# Patient Record
Sex: Female | Born: 1970 | Race: Black or African American | Hispanic: No | Marital: Married | Smoking: Never smoker
Health system: Southern US, Community
[De-identification: ages and names within clinical notes are randomized; demographics above are authoritative.]

## PROBLEM LIST (undated history)

## (undated) DIAGNOSIS — E669 Obesity, unspecified: Secondary | ICD-10-CM

## (undated) DIAGNOSIS — I2510 Atherosclerotic heart disease of native coronary artery without angina pectoris: Secondary | ICD-10-CM

## (undated) DIAGNOSIS — I10 Essential (primary) hypertension: Secondary | ICD-10-CM

## (undated) DIAGNOSIS — G43909 Migraine, unspecified, not intractable, without status migrainosus: Secondary | ICD-10-CM

## (undated) HISTORY — PX: BACK SURGERY: SHX140

## (undated) HISTORY — DX: Obesity, unspecified: E66.9

## (undated) HISTORY — DX: Atherosclerotic heart disease of native coronary artery without angina pectoris: I25.10

## (undated) HISTORY — DX: Essential (primary) hypertension: I10

---

## 2008-08-12 ENCOUNTER — Emergency Department (HOSPITAL_BASED_OUTPATIENT_CLINIC_OR_DEPARTMENT_OTHER): Admission: EM | Admit: 2008-08-12 | Discharge: 2008-08-12 | Payer: Self-pay | Admitting: Emergency Medicine

## 2009-06-12 ENCOUNTER — Encounter: Admission: RE | Admit: 2009-06-12 | Discharge: 2009-06-12 | Payer: Self-pay | Admitting: Infectious Diseases

## 2010-11-15 ENCOUNTER — Encounter: Payer: Self-pay | Admitting: Family Medicine

## 2012-07-31 ENCOUNTER — Other Ambulatory Visit (HOSPITAL_COMMUNITY)
Admission: RE | Admit: 2012-07-31 | Discharge: 2012-07-31 | Disposition: A | Discharge status: Home / Self Care | Payer: Self-pay | Source: Ambulatory Visit | Attending: Obstetrics and Gynecology | Admitting: Obstetrics and Gynecology

## 2012-07-31 DIAGNOSIS — Z124 Encounter for screening for malignant neoplasm of cervix: Secondary | ICD-10-CM | POA: Insufficient documentation

## 2020-10-01 ENCOUNTER — Emergency Department (HOSPITAL_BASED_OUTPATIENT_CLINIC_OR_DEPARTMENT_OTHER)
Admission: EM | Admit: 2020-10-01 | Discharge: 2020-10-01 | Disposition: A | Discharge status: Home / Self Care | Payer: 59 | Attending: Emergency Medicine | Admitting: Emergency Medicine

## 2020-10-01 ENCOUNTER — Other Ambulatory Visit: Payer: Self-pay

## 2020-10-01 ENCOUNTER — Emergency Department (HOSPITAL_BASED_OUTPATIENT_CLINIC_OR_DEPARTMENT_OTHER): Payer: 59

## 2020-10-01 ENCOUNTER — Encounter (HOSPITAL_BASED_OUTPATIENT_CLINIC_OR_DEPARTMENT_OTHER): Payer: Self-pay

## 2020-10-01 DIAGNOSIS — R079 Chest pain, unspecified: Secondary | ICD-10-CM

## 2020-10-01 DIAGNOSIS — M25512 Pain in left shoulder: Secondary | ICD-10-CM | POA: Insufficient documentation

## 2020-10-01 DIAGNOSIS — R0789 Other chest pain: Secondary | ICD-10-CM | POA: Insufficient documentation

## 2020-10-01 HISTORY — DX: Migraine, unspecified, not intractable, without status migrainosus: G43.909

## 2020-10-01 LAB — CBC WITH DIFFERENTIAL/PLATELET
Abs Immature Granulocytes: 0.02 10*3/uL (ref 0.00–0.07)
Basophils Absolute: 0.1 10*3/uL (ref 0.0–0.1)
Basophils Relative: 1 %
Eosinophils Absolute: 0.1 10*3/uL (ref 0.0–0.5)
Eosinophils Relative: 2 %
HCT: 36.9 % (ref 36.0–46.0)
Hemoglobin: 11.3 g/dL — ABNORMAL LOW (ref 12.0–15.0)
Immature Granulocytes: 0 %
Lymphocytes Relative: 47 %
Lymphs Abs: 3.4 10*3/uL (ref 0.7–4.0)
MCH: 24.1 pg — ABNORMAL LOW (ref 26.0–34.0)
MCHC: 30.6 g/dL (ref 30.0–36.0)
MCV: 78.8 fL — ABNORMAL LOW (ref 80.0–100.0)
Monocytes Absolute: 0.4 10*3/uL (ref 0.1–1.0)
Monocytes Relative: 6 %
Neutro Abs: 3.1 10*3/uL (ref 1.7–7.7)
Neutrophils Relative %: 44 %
Platelets: 433 10*3/uL — ABNORMAL HIGH (ref 150–400)
RBC: 4.68 MIL/uL (ref 3.87–5.11)
RDW: 14.4 % (ref 11.5–15.5)
WBC: 7.1 10*3/uL (ref 4.0–10.5)
nRBC: 0 % (ref 0.0–0.2)

## 2020-10-01 LAB — COMPREHENSIVE METABOLIC PANEL WITH GFR
ALT: 18 U/L (ref 0–44)
AST: 19 U/L (ref 15–41)
Albumin: 4 g/dL (ref 3.5–5.0)
Alkaline Phosphatase: 69 U/L (ref 38–126)
Anion gap: 6 (ref 5–15)
BUN: 15 mg/dL (ref 6–20)
CO2: 27 mmol/L (ref 22–32)
Calcium: 9 mg/dL (ref 8.9–10.3)
Chloride: 106 mmol/L (ref 98–111)
Creatinine, Ser: 0.86 mg/dL (ref 0.44–1.00)
GFR, Estimated: >60 mL/min
Glucose, Bld: 104 mg/dL — ABNORMAL HIGH (ref 70–99)
Potassium: 3.6 mmol/L (ref 3.5–5.1)
Sodium: 139 mmol/L (ref 135–145)
Total Bilirubin: 0.5 mg/dL (ref 0.3–1.2)
Total Protein: 7.7 g/dL (ref 6.5–8.1)

## 2020-10-01 LAB — TROPONIN I (HIGH SENSITIVITY): Troponin I (High Sensitivity): 8 ng/L (ref <18)

## 2020-10-01 LAB — D-DIMER, QUANTITATIVE: D-Dimer, Quant: <0.27 ug{FEU}/mL (ref 0.00–0.50)

## 2020-10-01 NOTE — ED Provider Notes (Signed)
[facility] EMERGENCY DEPARTMENT Provider Note   CSN: [id number] Arrival date & time: 10/01/20  1441     History Chief Complaint  Patient presents with  . Shoulder Pain    Jasmin Swanson is a 49 y.o. female.  HPI Patient is a 49 year old female with a history of migraines as well as hypertension who presents to the emergency department due to chest pain.  Patient states the pain started about 2 days ago.  No known trauma.  She reports pain to the left scapular region as well as the left chest.  Pain worsens with deep breaths as well as movement of the left upper extremity.  Mildly alleviates when sitting still.  She states her pain waxes and wanes.  Does not fully alleviate.  No associated shortness of breath or diaphoresis.  No history of blood clots, leg swelling, recent surgery/injury, immobilization.  She notes having a 2-hour plane ride about 1 month ago but otherwise no recent travel.  No fevers, abdominal pain, nausea, vomiting.    Past Medical History:  Diagnosis Date  . Migraine     There are no problems to display for this patient.   Past Surgical History:  Procedure Laterality Date  . BACK SURGERY       OB History   No obstetric history on file.     No family history on file.  Social History   Tobacco Use  . Smoking status: Never Smoker  . Smokeless tobacco: Never Used  Vaping Use  . Vaping Use: Never used  Substance Use Topics  . Alcohol use: Never  . Drug use: Never    Home Medications Prior to Admission medications   Not on File    Allergies    Patient has no known allergies.  Review of Systems   Review of Systems  All other systems reviewed and are negative. Ten systems reviewed and are negative for acute change, except as noted in the HPI.   Physical Exam Updated Vital Signs BP 111/74 (BP Location: Right Arm)   Pulse 73   Temp 99.6 F (37.6 C) (Oral)   Resp 18   Ht 5\' 7"  (1.702 m)   Wt 111.1 kg   LMP 08/25/2020   SpO2  100%   BMI 38.37 kg/m   Physical Exam Vitals and nursing note reviewed.  Constitutional:      General: She is not in acute distress.    Appearance: Normal appearance. She is not ill-appearing, toxic-appearing or diaphoretic.  HENT:     Head: Normocephalic and atraumatic.     Right Ear: External ear normal.     Left Ear: External ear normal.     Nose: Nose normal.     Mouth/Throat:     Mouth: Mucous membranes are moist.     Pharynx: Oropharynx is clear. No oropharyngeal exudate or posterior oropharyngeal erythema.  Eyes:     General: No scleral icterus.       Right eye: No discharge.        Left eye: No discharge.     Extraocular Movements: Extraocular movements intact.     Conjunctiva/sclera: Conjunctivae normal.  Cardiovascular:     Rate and Rhythm: Normal rate and regular rhythm.     Pulses: Normal pulses.     Heart sounds: Normal heart sounds. No murmur heard.  No friction rub. No gallop.      Comments: Minimal tenderness noted to the left anterior chest wall.  No crepitus.  Heart is regular  rate and rhythm.  No murmurs, rubs, gallops. Pulmonary:     Effort: Pulmonary effort is normal. No respiratory distress.     Breath sounds: Normal breath sounds. No stridor. No wheezing, rhonchi or rales.     Comments: Lungs are clear to auscultation bilaterally. Abdominal:     General: Abdomen is flat.     Palpations: Abdomen is soft.     Tenderness: There is no abdominal tenderness.  Musculoskeletal:        General: Normal range of motion.     Cervical back: Normal range of motion and neck supple. No tenderness.     Comments: No tenderness appreciated with palpation of the upper back.  Skin:    General: Skin is warm and dry.  Neurological:     General: No focal deficit present.     Mental Status: She is alert and oriented to person, place, and time.  Psychiatric:        Mood and Affect: Mood normal.        Behavior: Behavior normal.    ED Results / Procedures / Treatments    Labs (all labs ordered are listed, but only abnormal results are displayed) Labs Reviewed  COMPREHENSIVE METABOLIC PANEL - Abnormal; Notable for the following components:      Result Value   Glucose, Bld 104 (*)    All other components within normal limits  CBC WITH DIFFERENTIAL/PLATELET - Abnormal; Notable for the following components:   Hemoglobin 11.3 (*)    MCV 78.8 (*)    MCH 24.1 (*)    Platelets 433 (*)    All other components within normal limits  D-DIMER, QUANTITATIVE (NOT AT [facility])  TROPONIN I (HIGH SENSITIVITY)   EKG EKG Interpretation  Date/Time:  Wednesday October 01 2020 14:52:14 EST Ventricular Rate:  100 PR Interval:  148 QRS Duration: 74 QT Interval:  326 QTC Calculation: 420 R Axis:   44 Text Interpretation: Normal sinus rhythm Normal ECG No old tracing to compare Confirmed by Susy Frizzle [phone]) on 10/01/2020 6:38:07 PM  Radiology DG Chest Portable 1 View  Result Date: 10/01/2020 CLINICAL DATA:  Chest pain, back pain and LEFT scapular pain for 3 days, pain with deep breathing EXAM: PORTABLE CHEST 1 VIEW COMPARISON:  Portable exam 1743 hours compared to 06/12/2009 FINDINGS: Mild enlargement of cardiac silhouette. Mediastinal contours and pulmonary vascularity normal. Lungs clear. No infiltrate, pleural effusion, or pneumothorax. Osseous structures unremarkable. IMPRESSION: No acute abnormalities. Electronically Signed   By: Ulyses Southward M.D.   On: 10/01/2020 18:34    Procedures Procedures (including critical care time)  Medications Ordered in ED Medications - No data to display  ED Course  I have reviewed the triage vital signs and the nursing notes.  Pertinent labs & imaging results that were available during my care of the patient were reviewed by me and considered in my medical decision making (see chart for details).    MDM Rules/Calculators/A&P                          Patient is a 49 year old female who presents the emergency department  with pain in the left anterior chest as well as left upper back.  Started about 2 days ago.  No trauma.  Seems to worsen with deep breathing as well as palpation and movement of the left upper extremity.  Patient initially tachycardic upon arrival.  No known risk factors for DVT/PE.  No history of DVT/PE.  Not anticoagulated.  I obtained a D-dimer which was not elevated at less than 0.27.  Doubt DVT/PE at this time.  Patient's heart score is a 2.  Chest x-ray reassuring.  ECG normal sinus rhythm.  No elevation in troponin today.  Doubt ACS at this time.  CBC and CMP are generally reassuring.  Feel that her symptoms are likely musculoskeletal in nature.  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  She was concerned regarding her symptoms and them possibly being consistent with DVT/PE or ACS.  She was relieved regarding the results.  Patient feels comfortable being discharged at this time.  Feel that she is stable for discharge at this time.  Strict return precautions.  Gave her sports medicine follow-up if she finds her symptoms not improve.  Her questions were answered and she was amicable at the time of discharge.   Final Clinical Impression(s) / ED Diagnoses Final diagnoses:  Chest pain, unspecified type    Rx / DC Orders ED Discharge Orders    None       Placido Sou, PA-C 10/01/20 1941    Pollyann Savoy, MD 10/01/20 2050

## 2020-10-01 NOTE — Discharge Instructions (Signed)
I have given you the phone number below for Dr. Clare Gandy.  He is a sports medicine specialist and that is located in the same building as this emergency department.  If you find your symptoms do not improve, please reach out to him.  Please return to the ER with any new or worsening symptoms.  It was a pleasure to meet you.

## 2020-10-01 NOTE — ED Triage Notes (Addendum)
Pt c/o pain to left shoulder/left upper back pain x 2 days-pain worse when she takes a deep breath and when she moves arm-denies injury-NAD-steady gait

## 2021-07-25 IMAGING — DX DG CHEST 1V PORT
1 series · 1 of 1 positions shown · non-contrast
Comparison: Portable exam 2426 hours compared to 06/12/2009

CLINICAL DATA: Chest pain, back pain and LEFT scapular pain for 3
days, pain with deep breathing

EXAM:
PORTABLE CHEST 1 VIEW

[chest ap]
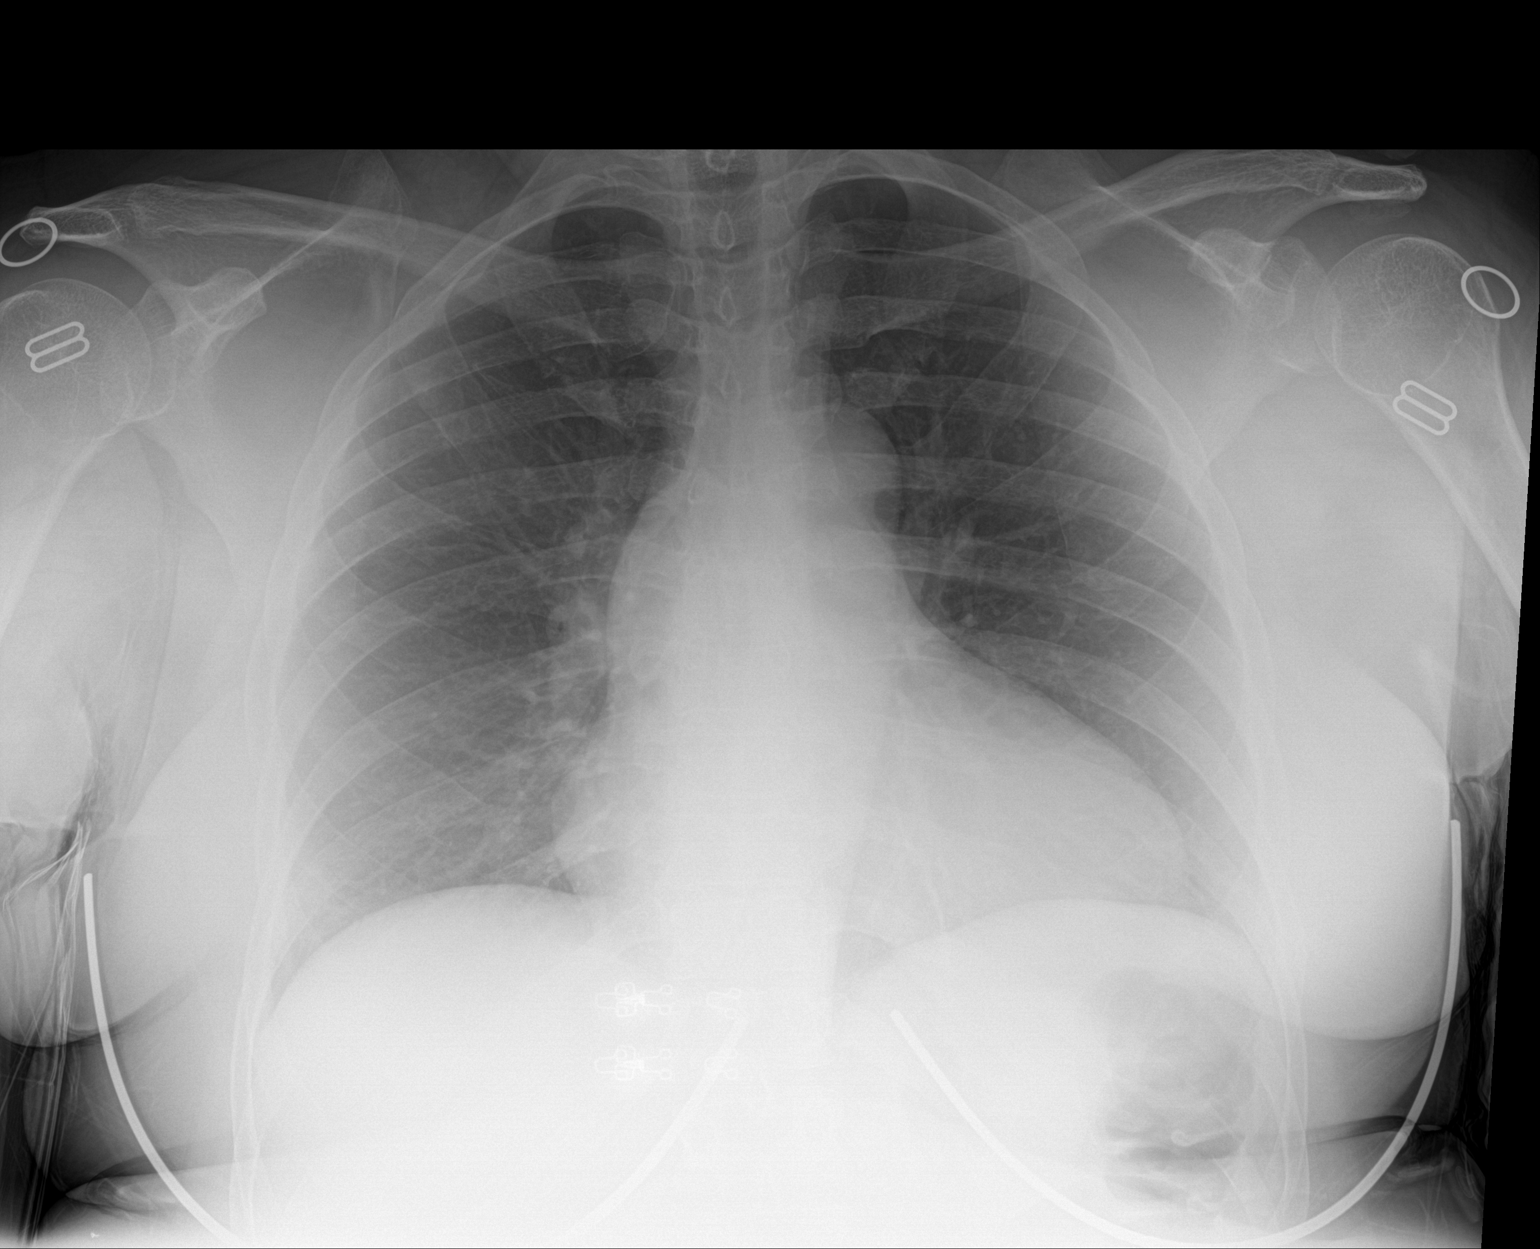

[1 of 1 positions shown; findings below may reference images not displayed]

FINDINGS: Mild enlargement of cardiac silhouette.

Mediastinal contours and pulmonary vascularity normal.

Lungs clear.

No infiltrate, pleural effusion, or pneumothorax.

Osseous structures unremarkable.
IMPRESSION: No acute abnormalities.

## 2021-11-16 DIAGNOSIS — G43909 Migraine, unspecified, not intractable, without status migrainosus: Secondary | ICD-10-CM | POA: Insufficient documentation

## 2021-11-16 LAB — HM PAP SMEAR

## 2021-11-18 ENCOUNTER — Other Ambulatory Visit: Payer: Self-pay | Admitting: Obstetrics and Gynecology

## 2021-11-18 DIAGNOSIS — E049 Nontoxic goiter, unspecified: Secondary | ICD-10-CM

## 2021-11-27 ENCOUNTER — Ambulatory Visit
Admission: RE | Admit: 2021-11-27 | Discharge: 2021-11-27 | Disposition: A | Discharge status: Home / Self Care | Payer: BC Managed Care – PPO | Source: Ambulatory Visit | Attending: Obstetrics and Gynecology | Admitting: Obstetrics and Gynecology

## 2021-11-27 ENCOUNTER — Other Ambulatory Visit: Payer: Self-pay

## 2021-11-27 ENCOUNTER — Other Ambulatory Visit: Payer: 59

## 2021-11-27 DIAGNOSIS — E049 Nontoxic goiter, unspecified: Secondary | ICD-10-CM

## 2022-09-20 IMAGING — US US THYROID
1 series · 14 of 25 positions shown · non-contrast
Comparison: None.

CLINICAL DATA: Goiter.

EXAM:
THYROID ULTRASOUND
TECHNIQUE: Ultrasound examination of the thyroid gland and adjacent soft
tissues was performed.

[Series 1: us thyroid · 0.06mm/px · 14 of 45 slices shown]
[im 1/45]
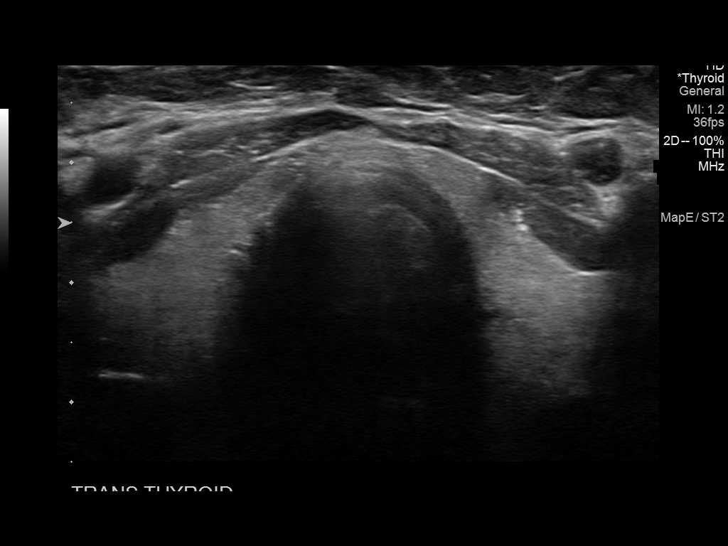
[im 4/45]
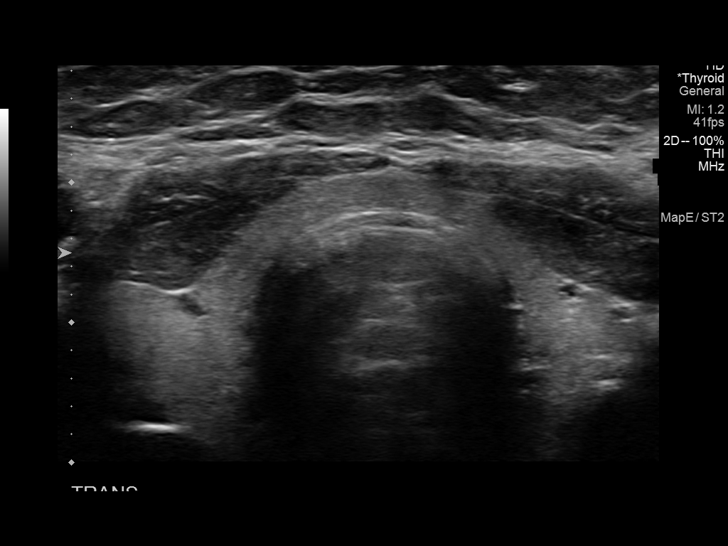
[im 8/45]
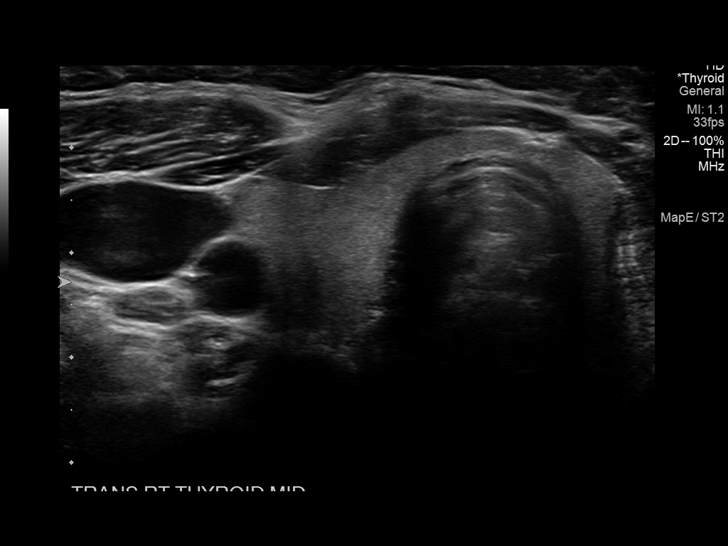
[im 12/45]
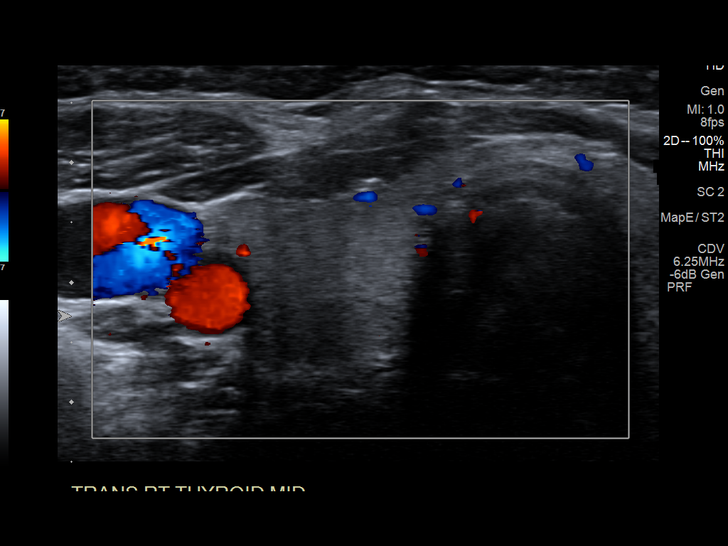
[im 15/45]
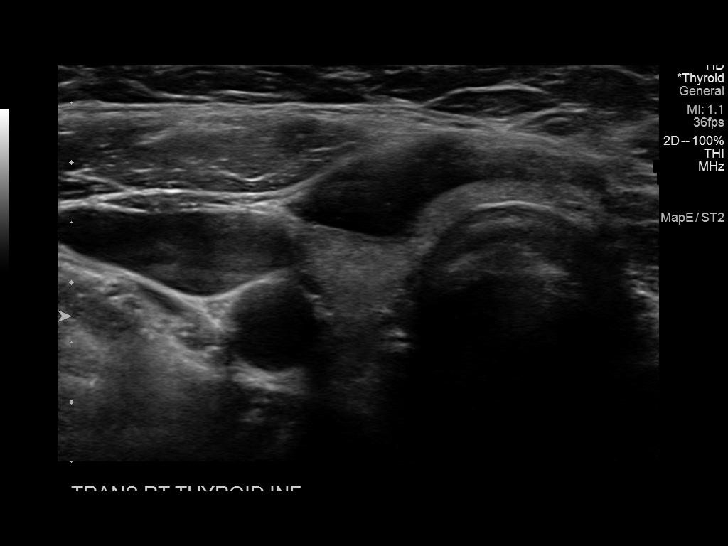
[im 17/45]
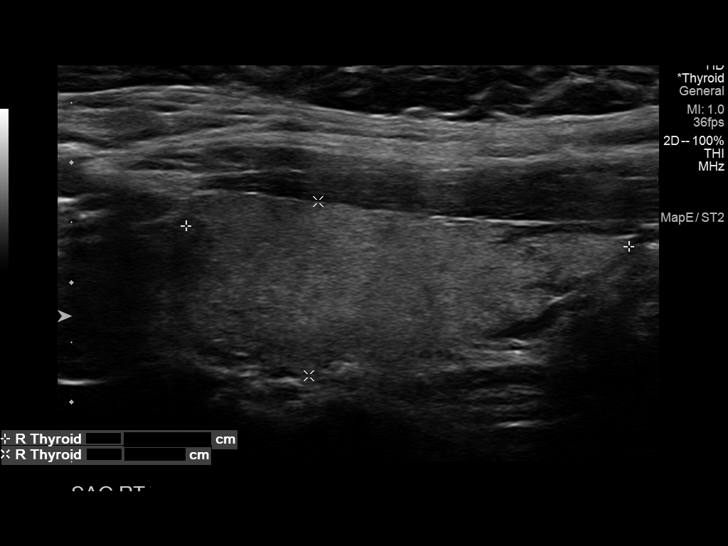
[im 21/45]
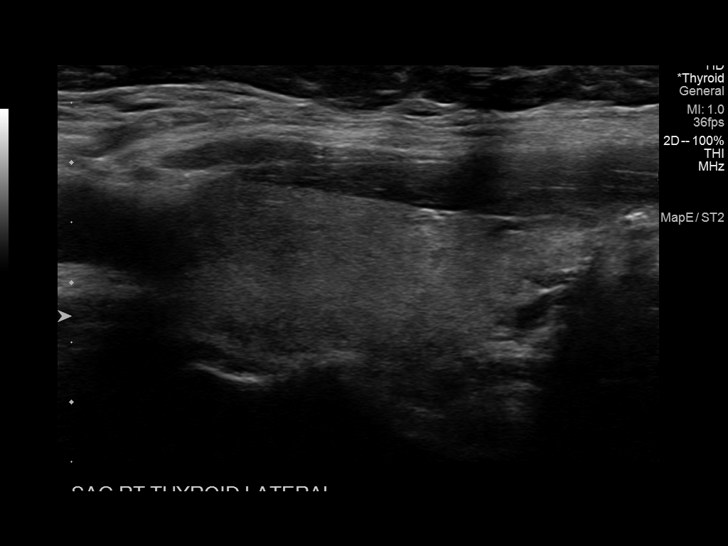
[im 24/45]
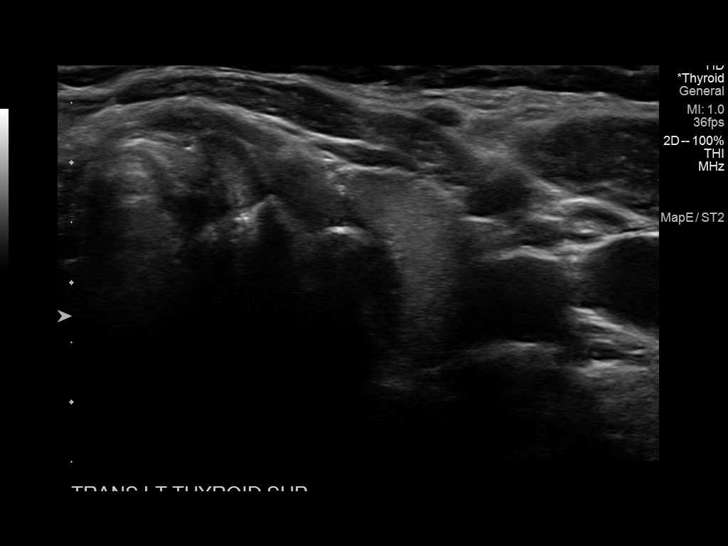
[im 28/45]
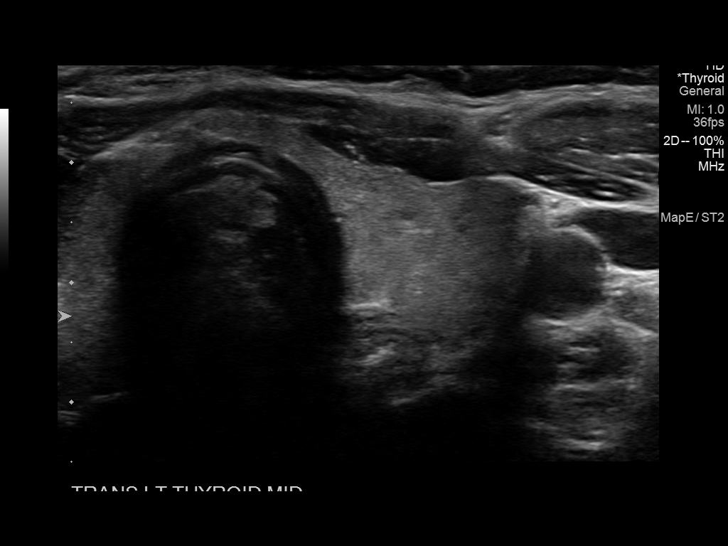
[im 30/45]
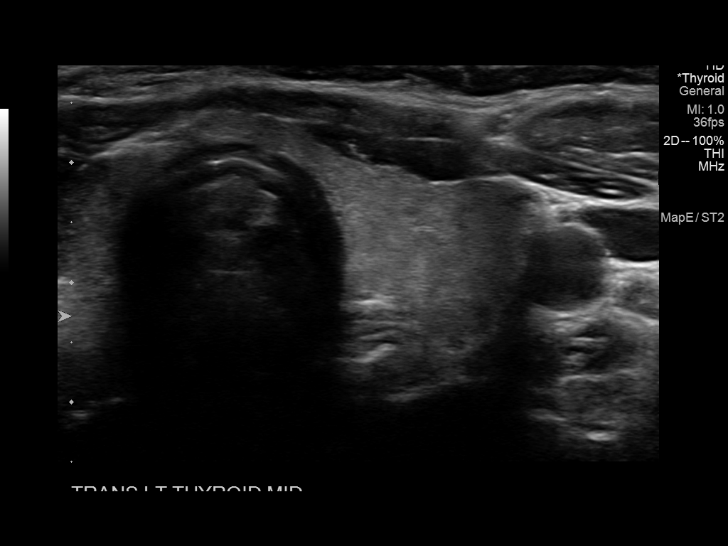
[im 34/45]
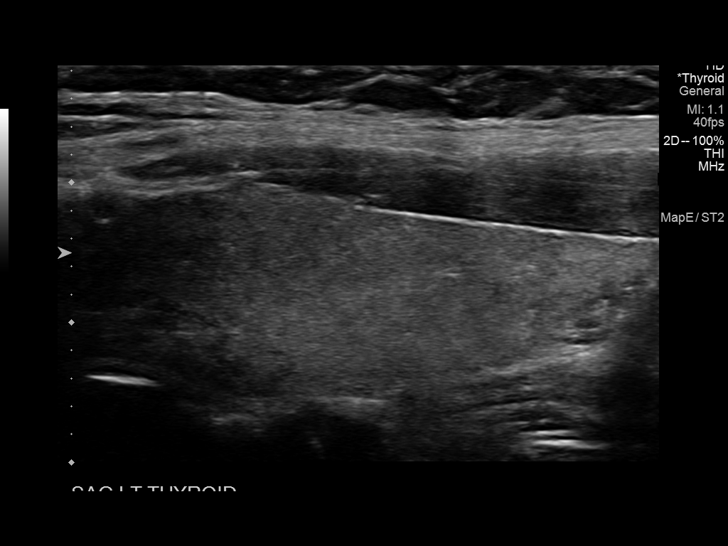
[im 37/45]
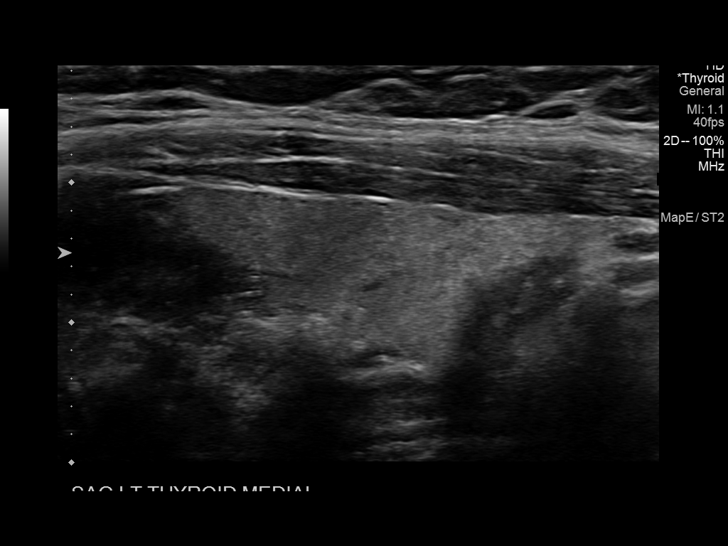
[im 41/45]
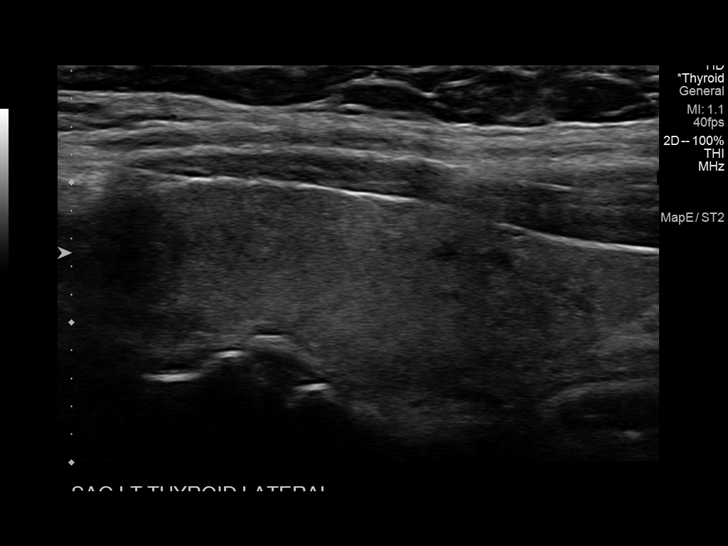
[im 45/45]
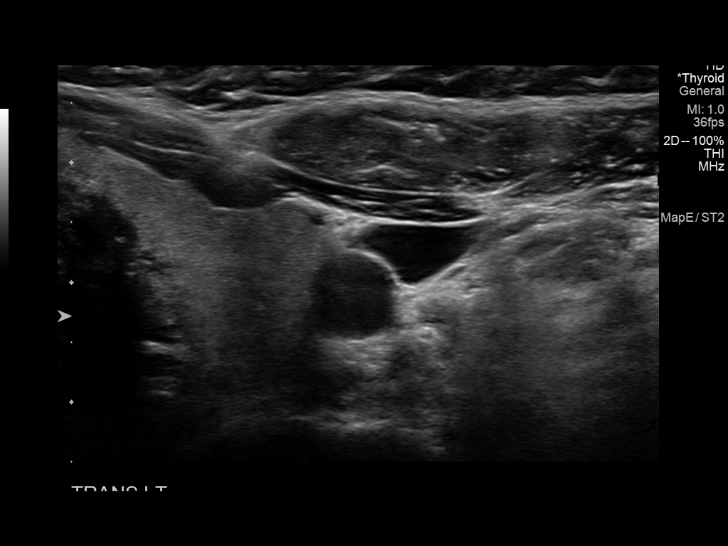

[14 of 25 positions shown; findings below may reference images not displayed]

FINDINGS: Parenchymal Echotexture: Normal

Isthmus: 0.3 cm

Right lobe: 3.7 x 1.5 x 1.7 cm

Left lobe: 3.5 x 1.4 x 1.5 cm

_________________________________________________________

Estimated total number of nodules >/= 1 cm: 0

Number of spongiform nodules >/=  2 cm not described below (TR1): 0

Number of mixed cystic and solid nodules >/= 1.5 cm not described
below (TR2): 0

_________________________________________________________

No discrete nodules are seen within the thyroid gland. No cervical
lymphadenopathy.
IMPRESSION: Normal sonographic appearance of the thyroid gland.

The above is in keeping with the ACR TI-RADS recommendations - [HOSPITAL] 5580;[DATE].

## 2022-12-23 LAB — HM MAMMOGRAPHY

## 2023-01-10 ENCOUNTER — Ambulatory Visit: Payer: BC Managed Care – PPO | Admitting: Internal Medicine

## 2023-01-25 ENCOUNTER — Ambulatory Visit: Payer: BC Managed Care – PPO | Admitting: Cardiology

## 2023-01-25 ENCOUNTER — Encounter: Payer: Self-pay | Admitting: Cardiology

## 2023-01-25 VITALS — BP 167/93 | HR 71 | Resp 18 | Ht 67.0 in | Wt 250.0 lb

## 2023-01-25 DIAGNOSIS — R072 Precordial pain: Secondary | ICD-10-CM

## 2023-01-25 DIAGNOSIS — I10 Essential (primary) hypertension: Secondary | ICD-10-CM

## 2023-01-25 DIAGNOSIS — Z13220 Encounter for screening for lipoid disorders: Secondary | ICD-10-CM

## 2023-01-25 MED ORDER — LOSARTAN POTASSIUM 50 MG PO TABS: 50.0000 mg | ORAL_TABLET | Freq: Every morning | ORAL | 2 refills | Status: DC

## 2023-01-25 NOTE — Progress Notes (Unsigned)
ID:  [name], DOB 06/01/1971, MRN [id number]  PCP:  System, Provider Not In  Cardiologist:  Jasmin Kras, DO, [facility] (established care 01/25/23) Former Cardiology Providers: None  REASON FOR CONSULT: Chest Pain  REQUESTING PHYSICIAN:  Jasmin Spanish, MD [address] [place],  [place] [address]  Chief Complaint  Patient presents with   Chest Pain   New Patient (Initial Visit)    HPI  Jasmin Swanson is a 52 y.o.  female who presents to the clinic for evaluation of chest pain at the request of Jasmin Swanson, Jasmin Baker, MD. Her past medical history and cardiovascular risk factors include: hypertension.   Patient was referred to the practice for evaluation and management of chest pain.  About a month ago, she returned back from [place] after visiting her daughter she woke up at night to go to the bathroom and noticed substernal chest pain.  Initially did not think much of it took aspirin and went back to sleep.  Patient states that the discomfort lasted for about 1 week but overall intensity improved.  She still has some residual discomfort that occurs intermittently.  She describes the discomfort as sharp, intensity 7 out of 10, at times brought on by effort related activities, resolving with rest.  She attributed her symptoms to heartburn and took Tums, she also has chronic back pain, and thought it could be gallbladder disease.  She was referred to cardiology for further evaluation and management.  Patient's blood pressures are also not well-controlled.  And is currently not on any antihypertensive medications.  Patient is married and states that her husband does not endorse episodes of apnea or snoring.  She has not been evaluated for sleep apnea in the past.  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  ALLERGIES: No Known Allergies  MEDICATION LIST PRIOR TO  VISIT: Current Meds  Medication Sig   celecoxib (CELEBREX) 200 MG capsule Take 200 mg by mouth daily.   losartan (COZAAR) 50 MG tablet Take 1 tablet (50 mg total) by mouth in the morning.   Oxycodone HCl 10 MG TABS Take 10 mg by mouth as needed.   pregabalin (LYRICA) 50 MG capsule Take 50 mg by mouth 3 (three) times daily.   Topiramate ER (TROKENDI XR) 200 MG CP24 Take by mouth.     PAST MEDICAL HISTORY: Past Medical History:  Diagnosis Date   Hypertension    Migraine    Obesity (BMI 30-39.9)     PAST SURGICAL HISTORY: Past Surgical History:  Procedure Laterality Date   BACK SURGERY      FAMILY HISTORY: The patient family history includes Diabetes in her father and mother; Hypertension in her brother, father, mother, and sister.  SOCIAL HISTORY:  The patient  reports that she has never smoked. She has never used smokeless tobacco. She reports that she does not drink alcohol and does not use drugs.  REVIEW OF SYSTEMS: Review of Systems  Cardiovascular:  Positive for chest pain. Negative for claudication, dyspnea on exertion, irregular heartbeat, leg swelling, near-syncope, orthopnea, palpitations, paroxysmal nocturnal dyspnea and syncope.  Respiratory:  Negative for shortness of breath.   Hematologic/Lymphatic: Negative for bleeding problem.  Musculoskeletal:  Negative for muscle cramps and myalgias.  Neurological:  Negative for dizziness and light-headedness.    PHYSICAL EXAM:    01/25/2023   11:13 AM 10/01/2020    7:41 PM  10/01/2020    5:51 PM  Vitals with BMI  Height 5\' 7"     Weight 250 lbs    BMI 0000000    Systolic [id number] 0000000 [id number]  Diastolic 93 78 95  Pulse 71 70 76    Physical Exam  Constitutional: No distress.  Age appropriate, hemodynamically stable.   Neck: No JVD present.  Cardiovascular: Normal rate, regular rhythm, S1 normal, S2 normal, intact distal pulses and normal pulses. Exam reveals no gallop, no S3 and no S4.  No murmur heard. Pulmonary/Chest:  Effort normal and breath sounds normal. No stridor. She has no wheezes. She has no rales.  Abdominal: Soft. Bowel sounds are normal. She exhibits no distension. There is no abdominal tenderness.  Musculoskeletal:        General: No edema.     Cervical back: Neck supple.  Neurological: She is alert and oriented to person, place, and time. She has intact cranial nerves (2-12).  Skin: Skin is warm and moist.   CARDIAC DATABASE: EKG: 01/25/2023: Sinus Rhythm 76bpm, without underlying injury or ischemia.  Echocardiogram: No results found for this or any previous visit from the past 1095 days.    Stress Testing: No results found for this or any previous visit from the past 1095 days.   Heart Catheterization: None  LABORATORY DATA:    Latest Ref Rng & Units 10/01/2020    5:48 PM  CBC  WBC 4.0 - 10.5 K/uL 7.1   Hemoglobin 12.0 - 15.0 g/dL 11.3   Hematocrit 36.0 - 46.0 % 36.9   Platelets 150 - 400 K/uL 433        Latest Ref Rng & Units 10/01/2020    5:48 PM  CMP  Glucose 70 - 99 mg/dL 104   BUN 6 - 20 mg/dL 15   Creatinine 0.44 - 1.00 mg/dL 0.86   Sodium 135 - 145 mmol/L 139   Potassium 3.5 - 5.1 mmol/L 3.6   Chloride 98 - 111 mmol/L 106   CO2 22 - 32 mmol/L 27   Calcium 8.9 - 10.3 mg/dL 9.0   Total Protein 6.5 - 8.1 g/dL 7.7   Total Bilirubin 0.3 - 1.2 mg/dL 0.5   Alkaline Phos 38 - 126 U/L 69   AST 15 - 41 U/L 19   ALT 0 - 44 U/L 18     Lipid Panel  No results found for: "CHOL", "TRIG", "HDL", "CHOLHDL", "VLDL", "LDLCALC", "LDLDIRECT", "LABVLDL"  No components found for: "NTPROBNP" No results for input(s): "PROBNP" in the last 8760 hours. No results for input(s): "TSH" in the last 8760 hours.  BMP No results for input(s): "NA", "K", "CL", "CO2", "GLUCOSE", "BUN", "CREATININE", "CALCIUM", "GFRNONAA", "GFRAA" in the last 8760 hours.  HEMOGLOBIN A1C No results found for: "HGBA1C", "MPG"  External Labs [facility] - 12/30/2022 Glucose 94, BUN/Cr 17/0.9. EGFR  84. Na/K 142/3.9.  H/H 13.2/42.5. MCV 92. Platelets 206 Magnesium: 2.32  IMPRESSION:    ICD-10-CM   1. Precordial pain  R07.2 EKG 12-Lead    PCV ECHOCARDIOGRAM COMPLETE    PCV CARDIAC STRESS TEST    CT CARDIAC SCORING (SELF PAY ONLY)    2. Essential hypertension  I10 losartan (COZAAR) 50 MG tablet    Basic metabolic panel    3. Screening, lipid  Z13.220 Direct LDL    Lipid Profile       RECOMMENDATIONS: Jasmin Swanson is a 52 y.o.  female whose past medical history and cardiac risk factors include: hypertension   Precordial Pain  Precordial discomfort has both cardiac and noncardiac features EKG personally reviewed with no underlying ischemic or injury pattern  Echo will be ordered to evaluate for structural heart disease and left ventricular systolic function.   EKG is interpretable patient is able to exercise we will proceed with exercise treadmill stress test.   Schedule Cardiac Calcium Score CT for further risk stratification. I educated if she has any new or worsening symptoms of chest pain to please call us or seek emergency care.   Essential hypertension Blood pressure is elevated in office today, with reported normal BP 130-140 SBP She is open to starting blood pressure medication as her PCP has deferred BP management to cardiology Encouraged weight loss and heart healthy diet Encouraged low sodium diet < 2000 mg a day Start Losartan 50 mg PO daily  Repeat BMP in 1 week. Patient is not interested in being evaluated for sleep apnea.  Still asked her to discuss this further with PCP.  Screening, Lipids She states she has her lipids checked back in November, but I do not have these results today,  Ordering Lipid and Direct LDL labs to be checked, patient provides consent  Data Reviewed: I have independently reviewed external notes provided by the referring provider as part of this office visit.   I have independently reviewed results of EKG, labs as part of medical  decision making. I have ordered the following tests:  Orders Placed This Encounter  Procedures   CT CARDIAC SCORING (SELF PAY ONLY)    Standing Status:   Future    Standing Expiration Date:   01/25/2024    Order Specific Question:   Is patient pregnant?    Answer:   Unknown (Please Explain)    Order Specific Question:   Preferred imaging location?    Answer:   External   Basic metabolic panel    Standing Status:   Future    Standing Expiration Date:   01/25/2024   Direct LDL    Standing Status:   Future    Standing Expiration Date:   01/25/2024   Lipid Profile    Standing Status:   Future    Standing Expiration Date:   01/25/2024   PCV CARDIAC STRESS TEST    Standing Status:   Future    Standing Expiration Date:   03/27/2023   EKG 12-Lead   PCV ECHOCARDIOGRAM COMPLETE    Standing Status:   Future    Standing Expiration Date:   01/25/2024   I have made medications changes at today's encounter as noted above.  FINAL MEDICATION LIST END OF ENCOUNTER: Meds ordered this encounter  Medications   losartan (COZAAR) 50 MG tablet    Sig: Take 1 tablet (50 mg total) by mouth in the morning.    Dispense:  30 tablet    Refill:  2    There are no discontinued medications.   Current Outpatient Medications:    celecoxib (CELEBREX) 200 MG capsule, Take 200 mg by mouth daily., Disp: , Rfl:    losartan (COZAAR) 50 MG tablet, Take 1 tablet (50 mg total) by mouth in the morning., Disp: 30 tablet, Rfl: 2   Oxycodone HCl 10 MG TABS, Take 10 mg by mouth as needed., Disp: , Rfl:    pregabalin (LYRICA) 50 MG capsule, Take 50 mg by mouth 3 (three) times daily., Disp: , Rfl:    Topiramate ER (TROKENDI XR) 200 MG CP24, Take by mouth., Disp: , Rfl:   Orders Placed This Encounter  Procedures   CT CARDIAC SCORING (SELF PAY ONLY)   Basic metabolic panel   Direct LDL   Lipid Profile   PCV CARDIAC STRESS TEST   EKG 12-Lead   PCV ECHOCARDIOGRAM COMPLETE    There are no Patient Instructions on file for this  visit.   --Continue cardiac medications as reconciled in final medication list. --Return in about 4 weeks (around 02/22/2023). or sooner if needed. --Continue follow-up with your primary care physician regarding the management of your other chronic comorbid conditions.  Patient's questions and concerns were addressed to her satisfaction. She voices understanding of the instructions provided during this encounter.   This note was created using a voice recognition software as a result there may be grammatical errors inadvertently enclosed that do not reflect the nature of this encounter. Every attempt is made to correct such errors.  Jasmin Swanson, [place], [facility]  Pager:  [phone] Office: [phone]

## 2023-01-27 ENCOUNTER — Encounter: Payer: Self-pay | Admitting: Cardiology

## 2023-02-03 ENCOUNTER — Other Ambulatory Visit: Payer: Self-pay | Admitting: Cardiology

## 2023-02-04 LAB — LIPID PANEL W/O CHOL/HDL RATIO
Cholesterol, Total: 167 mg/dL (ref 100–199)
HDL: 50 mg/dL (ref >39)
LDL Chol Calc (NIH): 98 mg/dL (ref 0–99)
Triglycerides: 105 mg/dL (ref 0–149)
VLDL Cholesterol Cal: 19 mg/dL (ref 5–40)

## 2023-02-04 LAB — LDL CHOLESTEROL, DIRECT: LDL Direct: 103 mg/dL — ABNORMAL HIGH (ref 0–99)

## 2023-02-04 LAB — BASIC METABOLIC PANEL
BUN/Creatinine Ratio: 15 (ref 9–23)
BUN: 13 mg/dL (ref 6–24)
CO2: 25 mmol/L (ref 20–29)
Calcium: 9.8 mg/dL (ref 8.7–10.2)
Chloride: 104 mmol/L (ref 96–106)
Creatinine, Ser: 0.86 mg/dL (ref 0.57–1.00)
Glucose: 114 mg/dL — ABNORMAL HIGH (ref 70–99)
Potassium: 4.1 mmol/L (ref 3.5–5.2)
Sodium: 144 mmol/L (ref 134–144)
eGFR: 82 mL/min/{1.73_m2} (ref >59)

## 2023-02-14 ENCOUNTER — Telehealth: Payer: Self-pay

## 2023-02-14 NOTE — Telephone Encounter (Signed)
Patient stated that she was recently started on Losartan and her blood pressures are still running 150-160/80-90's. Patient wants to know what she should do, because she "feels bad" when her pressure is up. She wants to know if you want her to come back in, or change her blood pressure medication. Please advise.

## 2023-02-16 NOTE — Telephone Encounter (Signed)
Please have her increase losartan to 100 mg p.o. daily. Continue checking blood pressures twice a day when she is calm collected and relaxed and around the same time Keep a log of her blood pressures And please place labs and 1 week from now to recheck kidney function.  Order and release BMP.  Jasmin Swanson [place], DO, [facility]

## 2023-02-21 ENCOUNTER — Other Ambulatory Visit: Payer: Self-pay

## 2023-02-21 ENCOUNTER — Telehealth: Payer: Self-pay

## 2023-02-21 DIAGNOSIS — I10 Essential (primary) hypertension: Secondary | ICD-10-CM

## 2023-02-21 NOTE — Telephone Encounter (Signed)
Patient called about a call she had a few days ago about her blood pressure. Informed patient what Dr. Odis Hollingshead wanted her to do   Increase Lorsartan 100 mg and to go to [facility] a week later to check her BMP. Patient understood.

## 2023-02-28 ENCOUNTER — Other Ambulatory Visit: Payer: No Typology Code available for payment source

## 2023-03-07 ENCOUNTER — Other Ambulatory Visit: Payer: Self-pay | Admitting: Cardiology

## 2023-03-07 ENCOUNTER — Ambulatory Visit: Payer: No Typology Code available for payment source | Admitting: Cardiology

## 2023-03-07 DIAGNOSIS — R072 Precordial pain: Secondary | ICD-10-CM

## 2023-03-14 ENCOUNTER — Ambulatory Visit (HOSPITAL_BASED_OUTPATIENT_CLINIC_OR_DEPARTMENT_OTHER): Payer: BC Managed Care – PPO

## 2023-03-14 ENCOUNTER — Telehealth (HOSPITAL_BASED_OUTPATIENT_CLINIC_OR_DEPARTMENT_OTHER): Payer: Self-pay

## 2023-03-14 DIAGNOSIS — M48061 Spinal stenosis, lumbar region without neurogenic claudication: Secondary | ICD-10-CM | POA: Diagnosis not present

## 2023-03-14 DIAGNOSIS — E669 Obesity, unspecified: Secondary | ICD-10-CM | POA: Diagnosis not present

## 2023-03-14 DIAGNOSIS — G8929 Other chronic pain: Secondary | ICD-10-CM | POA: Diagnosis not present

## 2023-03-14 DIAGNOSIS — M5137 Other intervertebral disc degeneration, lumbosacral region: Secondary | ICD-10-CM | POA: Diagnosis not present

## 2023-03-14 DIAGNOSIS — E6609 Other obesity due to excess calories: Secondary | ICD-10-CM | POA: Diagnosis not present

## 2023-03-14 DIAGNOSIS — M549 Dorsalgia, unspecified: Secondary | ICD-10-CM | POA: Diagnosis not present

## 2023-03-15 ENCOUNTER — Telehealth (HOSPITAL_BASED_OUTPATIENT_CLINIC_OR_DEPARTMENT_OTHER): Payer: Self-pay

## 2023-04-04 ENCOUNTER — Ambulatory Visit (HOSPITAL_BASED_OUTPATIENT_CLINIC_OR_DEPARTMENT_OTHER)
Admission: RE | Admit: 2023-04-04 | Discharge: 2023-04-04 | Disposition: A | Discharge status: Home / Self Care | Payer: Self-pay | Source: Ambulatory Visit | Attending: Cardiology | Admitting: Cardiology

## 2023-04-04 DIAGNOSIS — R072 Precordial pain: Secondary | ICD-10-CM | POA: Insufficient documentation

## 2023-04-07 NOTE — Progress Notes (Signed)
Called patient, Na, LMAM.

## 2023-04-08 NOTE — Progress Notes (Signed)
Patient informed. 

## 2023-04-11 ENCOUNTER — Ambulatory Visit: Payer: Commercial Managed Care - PPO

## 2023-04-11 DIAGNOSIS — R072 Precordial pain: Secondary | ICD-10-CM | POA: Diagnosis not present

## 2023-04-18 ENCOUNTER — Other Ambulatory Visit: Payer: Self-pay | Admitting: Cardiology

## 2023-04-18 DIAGNOSIS — I10 Essential (primary) hypertension: Secondary | ICD-10-CM

## 2023-04-19 ENCOUNTER — Encounter: Payer: Self-pay | Admitting: Cardiology

## 2023-04-19 ENCOUNTER — Ambulatory Visit: Payer: Commercial Managed Care - PPO | Admitting: Cardiology

## 2023-04-19 VITALS — BP 150/86 | HR 64 | Resp 16 | Ht 67.0 in | Wt 249.0 lb

## 2023-04-19 DIAGNOSIS — I10 Essential (primary) hypertension: Secondary | ICD-10-CM | POA: Diagnosis not present

## 2023-04-19 DIAGNOSIS — E6609 Other obesity due to excess calories: Secondary | ICD-10-CM

## 2023-04-19 DIAGNOSIS — R931 Abnormal findings on diagnostic imaging of heart and coronary circulation: Secondary | ICD-10-CM

## 2023-04-19 DIAGNOSIS — R072 Precordial pain: Secondary | ICD-10-CM | POA: Diagnosis not present

## 2023-04-19 DIAGNOSIS — I77810 Thoracic aortic ectasia: Secondary | ICD-10-CM | POA: Diagnosis not present

## 2023-04-19 DIAGNOSIS — Z6839 Body mass index (BMI) 39.0-39.9, adult: Secondary | ICD-10-CM | POA: Diagnosis not present

## 2023-04-19 DIAGNOSIS — E66812 Obesity, class 2: Secondary | ICD-10-CM

## 2023-04-19 MED ORDER — CHLORTHALIDONE 25 MG PO TABS: 12.5000 mg | ORAL_TABLET | Freq: Every morning | ORAL | 0 refills | Status: DC

## 2023-04-19 MED ORDER — LOSARTAN POTASSIUM 100 MG PO TABS: 100.0000 mg | ORAL_TABLET | Freq: Every day | ORAL | 0 refills | Status: DC

## 2023-04-19 NOTE — Progress Notes (Signed)
ID:  Jasmin Swanson, DOB 07-25-1971, MRN [id number]  PCP:  Patient, No Pcp Per  Cardiologist:  Tessa Lerner, DO, [facility] (established care 01/25/23) Former Cardiology Providers: None  Date: 04/19/23 Last Office Visit: 01/25/2023  Chief Complaint  Patient presents with   Precordial pain   Follow-up    HPI  Jasmin Swanson is a 52 y.o.  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   Was referred to the practice for evaluation of chest pain and blood pressure management.  Chest pain: At the time of initial consult her symptoms were suggestive of both cardiac and noncardiac features.  Shared decision was to proceed with echo, stress test and coronary calcium score.  Results reviewed with her in detail and noted below for further reference.  Clinically her overall intensity and frequency of precordial discomfort has improved but still present.  To the best of her knowledge the symptoms are not brought on by effort related activities and does not always resolve with rest but sometimes contributory.  Of note, GXT was inconclusive as the stress ECG was uninterpretable, she also illustrated hypertensive response to exercise, and had to stop due to sciatic discomfort.  Benign essential hypertension: At the last office visit she was restarted on the losartan 50 mg p.o. daily.  However patient states that the blood pressures consistently stated 150 mmHg and she chose not to check it.  She did reach out to the office and I recommended increasing losartan to 100 mg p.o. daily with repeat labs in 1 week.  She did try taking a higher dose of losartan and her blood pressures improved to 130 mmHg.  However because she was concerned of her renal function she stopped taking losartan 100 mg p.o. daily.  Patient is married and states that her husband does not  endorse episodes of apnea or snoring.  She has not been evaluated for sleep apnea in the past.  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  ALLERGIES: No Known Allergies  MEDICATION LIST PRIOR TO VISIT: Current Meds  Medication Sig   celecoxib (CELEBREX) 200 MG capsule Take 200 mg by mouth daily.   chlorthalidone (HYGROTON) 25 MG tablet Take 0.5 tablets (12.5 mg total) by mouth in the morning.   losartan (COZAAR) 100 MG tablet Take 1 tablet (100 mg total) by mouth daily at 10 pm.   Oxycodone HCl 10 MG TABS Take 10 mg by mouth as needed.   pregabalin (LYRICA) 50 MG capsule Take 50 mg by mouth 3 (three) times daily.   Topiramate ER (TROKENDI XR) 200 MG CP24 Take by mouth.   [DISCONTINUED] losartan (COZAAR) 50 MG tablet TAKE 1 TABLET BY MOUTH IN THE MORNING     PAST MEDICAL HISTORY: Past Medical History:  Diagnosis Date   Hypertension    Migraine    Obesity (BMI 30-39.9)     PAST SURGICAL HISTORY: Past Surgical History:  Procedure Laterality Date   BACK SURGERY      FAMILY HISTORY: The patient family history includes Diabetes in her father and mother; Hypertension in her brother, father, mother, and sister.  SOCIAL HISTORY:  The patient  reports that she has never smoked. She has never used smokeless tobacco. She reports that she does not drink alcohol and does  not use drugs.  REVIEW OF SYSTEMS: Review of Systems  Cardiovascular:  Positive for chest pain (Chronic and stable). Negative for claudication, dyspnea on exertion, irregular heartbeat, leg swelling, near-syncope, orthopnea, palpitations, paroxysmal nocturnal dyspnea and syncope.  Respiratory:  Negative for shortness of breath.   Hematologic/Lymphatic: Negative for bleeding problem.  Musculoskeletal:  Negative for muscle cramps and myalgias.  Neurological:  Negative for dizziness and  light-headedness.    PHYSICAL EXAM:    04/19/2023   11:09 AM 04/19/2023   11:07 AM 01/25/2023   11:13 AM  Vitals with BMI  Height  5\' 7"  5\' 7"   Weight  249 lbs 250 lbs  BMI  38.99 39.15  Systolic 150 152 409  Diastolic 86 87 93  Pulse 64 66 71    Physical Exam  Constitutional: No distress.  Age appropriate, hemodynamically stable.   Neck: No JVD present.  Cardiovascular: Normal rate, regular rhythm, S1 normal, S2 normal, intact distal pulses and normal pulses. Exam reveals no gallop, no S3 and no S4.  No murmur heard. Pulmonary/Chest: Effort normal and breath sounds normal. No stridor. She has no wheezes. She has no rales.  Abdominal: Soft. Bowel sounds are normal. She exhibits no distension. There is no abdominal tenderness.  Musculoskeletal:        General: No edema.     Cervical back: Neck supple.  Neurological: She is alert and oriented to person, place, and time. She has intact cranial nerves (2-12).  Skin: Skin is warm and moist.   CARDIAC DATABASE: EKG: 01/25/2023: Sinus Rhythm 76bpm, without underlying injury or ischemia.  Echocardiogram: 04/11/2023:  Normal LV systolic function with visual EF 60-65%. Left ventricle cavity is normal in size. Normal global wall motion. Normal diastolic filling  pattern, normal LAP. Moderate left ventricular hypertrophy.  Mild (Grade I) mitral regurgitation.  No prior study for comparison.   Stress Testing: Exercise treadmill stress test 04/11/2023: Functional status: Poor.  Chest pain: No. Reason for stopping exercise: Fatigue/weakness Hypertensive response to exercise: Yes (rest 140/90, peak 200/110) Exercise time 2 minutes 58 seconds on Bruce protocol, achieved 4.64 METS, 86% of age-predicted maximum heart rate (APMHR).  Stress ECG equivocal for ischemia uninterpretable ST-T rate related/motion artifact.   If clinically indicated consider a different modality to evaluate for ischemia.   Heart Catheterization: None  CT  Cardiac Scoring: 04/04/2023 Left main 0. LAD 36. LCx 0. RCA 0. 1. Coronary calcium score of 36. This was 94th percentile for age,gender, and race matched controls.  2. Evidence of mild ascending aortic dilation, 40 mm, onnon-contrasted study. Consider secondary imaging modality (echocardiogram, CTA Aorta Protocol, MRA Aorta Protocol) if clinically indicated.  Noncardiac findings: No acute or unexpected extracardiac findings.   LABORATORY DATA:    Latest Ref Rng & Units 10/01/2020    5:48 PM  CBC  WBC 4.0 - 10.5 K/uL 7.1   Hemoglobin 12.0 - 15.0 g/dL 81.1   Hematocrit 91.4 - 46.0 % 36.9   Platelets 150 - 400 K/uL 433        Latest Ref Rng & Units 02/03/2023    2:38 PM 10/01/2020    5:48 PM  CMP  Glucose 70 - 99 mg/dL 782  956   BUN 6 - 24 mg/dL 13  15   Creatinine 2.13 - 1.00 mg/dL 0.86  5.78   Sodium 469 - 144 mmol/L 144  139   Potassium 3.5 - 5.2 mmol/L 4.1  3.6   Chloride 96 - 106 mmol/L 104  106  CO2 20 - 29 mmol/L 25  27   Calcium 8.7 - 10.2 mg/dL 9.8  9.0   Total Protein 6.5 - 8.1 g/dL  7.7   Total Bilirubin 0.3 - 1.2 mg/dL  0.5   Alkaline Phos 38 - 126 U/L  69   AST 15 - 41 U/L  19   ALT 0 - 44 U/L  18     Lipid Panel     Component Value Date/Time   CHOL 167 02/03/2023 1438   TRIG 105 02/03/2023 1438   HDL 50 02/03/2023 1438   LDLCALC 98 02/03/2023 1438   LDLDIRECT 103 (H) 02/03/2023 1438   LABVLDL 19 02/03/2023 1438    No components found for: "NTPROBNP" No results for input(s): "PROBNP" in the last 8760 hours. No results for input(s): "TSH" in the last 8760 hours.  BMP Recent Labs    02/03/23 1438  NA 144  K 4.1  CL 104  CO2 25  GLUCOSE 114*  BUN 13  CREATININE 0.86  CALCIUM 9.8    HEMOGLOBIN A1C No results found for: "HGBA1C", "MPG"  External Labs [facility] - 12/30/2022 Glucose 94, BUN/Cr 17/0.9. EGFR 84. Na/K 142/3.9.  H/H 13.2/42.5. MCV 92. Platelets 206 Magnesium: 2.32  IMPRESSION:    ICD-10-CM   1. Essential  hypertension  I10 losartan (COZAAR) 100 MG tablet    chlorthalidone (HYGROTON) 25 MG tablet    Basic metabolic panel    Magnesium    2. Precordial pain  R07.2     3. Agatston coronary artery calcium score less than 100  R93.1     4. Ascending aorta dilatation (HCC)  I77.810 CT CHEST WO CONTRAST    CANCELED: CT CHEST WO CONTRAST    5. Class 2 obesity due to excess calories without serious comorbidity with body mass index (BMI) of 39.0 to 39.9 in adult  E66.09    Z68.39        RECOMMENDATIONS: Jasmin Swanson is a 52 y.o.  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   Essential hypertension No significant change with losartan 50 mg p.o. daily. Follow-up labs independently reviewed. Patient is agreeable to increasing losartan to 100 mg p.o. every afternoon. Start chlorthalidone 12.5 mg p.o. every morning. Labs in 1 week to evaluate kidney function and electrolytes. Reemphasized importance of low-salt diet. Encouraged her to discuss the role of sleep apnea and to be evaluated with PCP  Precordial pain Agatston coronary artery calcium score less than 100 Predominately noncardiac. Echocardiogram: Preserved LVEF, see report for additional details. GXT was inconclusive as a stress ECG was uninterpretable.  But she did illustrate hypertensive response to exercise. Patient is noted to have mild coronary calcification & based on current parameters for 10-year risk of ASCVD is approximately 6.6%.  Moderate intensity statin should be considered. However, with the initiation of antihypertensive medications if she has muscle cramps she may contributed to statin therapy.  As result the shared decision was to address the blood pressure first and discussed cholesterol medications at the follow-up visits patient agreeable to plan of care.  Ascending  aorta dilatation (HCC) Reemphasized importance of blood pressure monitoring. Will recheck CT of the chest in 1 year to reevaluate dimensions  Class 2 obesity due to excess calories without serious comorbidity with body mass index (BMI) of 39.0 to 39.9 in adult Body mass index is 39 kg/m. I reviewed  with her importance of diet, regular physical activity/exercise, weight loss.   Patient is educated on the importance of increasing physical activity gradually as tolerated with a goal of moderate intensity exercise for 30 minutes a day 5 days a week.  FINAL MEDICATION LIST END OF ENCOUNTER: Meds ordered this encounter  Medications   losartan (COZAAR) 100 MG tablet    Sig: Take 1 tablet (100 mg total) by mouth daily at 10 pm.    Dispense:  90 tablet    Refill:  0   chlorthalidone (HYGROTON) 25 MG tablet    Sig: Take 0.5 tablets (12.5 mg total) by mouth in the morning.    Dispense:  30 tablet    Refill:  0    Medications Discontinued During This Encounter  Medication Reason   losartan (COZAAR) 50 MG tablet Dose change     Current Outpatient Medications:    celecoxib (CELEBREX) 200 MG capsule, Take 200 mg by mouth daily., Disp: , Rfl:    chlorthalidone (HYGROTON) 25 MG tablet, Take 0.5 tablets (12.5 mg total) by mouth in the morning., Disp: 30 tablet, Rfl: 0   losartan (COZAAR) 100 MG tablet, Take 1 tablet (100 mg total) by mouth daily at 10 pm., Disp: 90 tablet, Rfl: 0   Oxycodone HCl 10 MG TABS, Take 10 mg by mouth as needed., Disp: , Rfl:    pregabalin (LYRICA) 50 MG capsule, Take 50 mg by mouth 3 (three) times daily., Disp: , Rfl:    Topiramate ER (TROKENDI XR) 200 MG CP24, Take by mouth., Disp: , Rfl:   Orders Placed This Encounter  Procedures   CT CHEST WO CONTRAST   Basic metabolic panel   Magnesium    There are no Patient Instructions on file for this visit.   --Continue cardiac medications as reconciled in final medication list. --Return in about 4 weeks (around  05/17/2023) for Follow up, BP, Coronary artery calcification. or sooner if needed. --Continue follow-up with your primary care physician regarding the management of your other chronic comorbid conditions.  Patient's questions and concerns were addressed to her satisfaction. She voices understanding of the instructions provided during this encounter.   This note was created using a voice recognition software as a result there may be grammatical errors inadvertently enclosed that do not reflect the nature of this encounter. Every attempt is made to correct such errors.  Tessa Lerner, [place], [facility]  Pager:  [phone] Office: [phone]

## 2023-04-20 ENCOUNTER — Other Ambulatory Visit (HOSPITAL_BASED_OUTPATIENT_CLINIC_OR_DEPARTMENT_OTHER): Payer: Self-pay

## 2023-05-02 DIAGNOSIS — Z6834 Body mass index (BMI) 34.0-34.9, adult: Secondary | ICD-10-CM | POA: Diagnosis not present

## 2023-05-02 DIAGNOSIS — G43909 Migraine, unspecified, not intractable, without status migrainosus: Secondary | ICD-10-CM | POA: Diagnosis not present

## 2023-05-02 DIAGNOSIS — Z1211 Encounter for screening for malignant neoplasm of colon: Secondary | ICD-10-CM | POA: Diagnosis not present

## 2023-05-02 DIAGNOSIS — E669 Obesity, unspecified: Secondary | ICD-10-CM | POA: Diagnosis not present

## 2023-05-02 DIAGNOSIS — Z6838 Body mass index (BMI) 38.0-38.9, adult: Secondary | ICD-10-CM | POA: Diagnosis not present

## 2023-05-06 ENCOUNTER — Other Ambulatory Visit: Payer: Self-pay

## 2023-05-06 ENCOUNTER — Other Ambulatory Visit (HOSPITAL_BASED_OUTPATIENT_CLINIC_OR_DEPARTMENT_OTHER): Payer: Self-pay

## 2023-05-06 DIAGNOSIS — I10 Essential (primary) hypertension: Secondary | ICD-10-CM

## 2023-05-06 DIAGNOSIS — Z13220 Encounter for screening for lipoid disorders: Secondary | ICD-10-CM

## 2023-05-06 MED ORDER — TOPIRAMATE ER 200 MG PO CAP24
200.0000 mg | ORAL_CAPSULE | Freq: Every day | ORAL | 2 refills | Status: DC
  Filled 2023-05-06: qty 90, 90d supply, fill #0

## 2023-05-09 ENCOUNTER — Other Ambulatory Visit (HOSPITAL_BASED_OUTPATIENT_CLINIC_OR_DEPARTMENT_OTHER): Payer: Self-pay

## 2023-05-17 DIAGNOSIS — I10 Essential (primary) hypertension: Secondary | ICD-10-CM | POA: Diagnosis not present

## 2023-05-17 DIAGNOSIS — Z13220 Encounter for screening for lipoid disorders: Secondary | ICD-10-CM | POA: Diagnosis not present

## 2023-05-18 LAB — BASIC METABOLIC PANEL
BUN/Creatinine Ratio: 18 (ref 9–23)
BUN: 17 mg/dL (ref 6–24)
CO2: 25 mmol/L (ref 20–29)
Calcium: 9.7 mg/dL (ref 8.7–10.2)
Chloride: 104 mmol/L (ref 96–106)
Creatinine, Ser: 0.94 mg/dL (ref 0.57–1.00)
Glucose: 97 mg/dL (ref 70–99)
Potassium: 3.9 mmol/L (ref 3.5–5.2)
Sodium: 142 mmol/L (ref 134–144)
eGFR: 73 mL/min/{1.73_m2} (ref >59)

## 2023-05-18 LAB — BMP8+EGFR
BUN/Creatinine Ratio: 19 (ref 9–23)
BUN: 17 mg/dL (ref 6–24)
CO2: 26 mmol/L (ref 20–29)
Calcium: 9.5 mg/dL (ref 8.7–10.2)
Chloride: 102 mmol/L (ref 96–106)
Creatinine, Ser: 0.89 mg/dL (ref 0.57–1.00)
Glucose: 96 mg/dL (ref 70–99)
Potassium: 3.8 mmol/L (ref 3.5–5.2)
Sodium: 142 mmol/L (ref 134–144)
eGFR: 78 mL/min/{1.73_m2} (ref >59)

## 2023-05-18 LAB — LIPID PANEL
Chol/HDL Ratio: 3.3 ratio (ref 0.0–4.4)
Cholesterol, Total: 166 mg/dL (ref 100–199)
HDL: 50 mg/dL (ref >39)
LDL Chol Calc (NIH): 99 mg/dL (ref 0–99)
Triglycerides: 91 mg/dL (ref 0–149)
VLDL Cholesterol Cal: 17 mg/dL (ref 5–40)

## 2023-05-18 LAB — LDL CHOLESTEROL, DIRECT: LDL Direct: 105 mg/dL — ABNORMAL HIGH (ref 0–99)

## 2023-05-18 LAB — MAGNESIUM: Magnesium: 2.3 mg/dL (ref 1.6–2.3)

## 2023-05-23 ENCOUNTER — Encounter: Payer: Self-pay | Admitting: Cardiology

## 2023-05-23 ENCOUNTER — Ambulatory Visit: Payer: Commercial Managed Care - PPO | Admitting: Cardiology

## 2023-05-23 VITALS — BP 131/74 | HR 78 | Resp 16 | Ht 67.0 in | Wt 243.0 lb

## 2023-05-23 DIAGNOSIS — R931 Abnormal findings on diagnostic imaging of heart and coronary circulation: Secondary | ICD-10-CM

## 2023-05-23 DIAGNOSIS — I10 Essential (primary) hypertension: Secondary | ICD-10-CM

## 2023-05-23 DIAGNOSIS — I77810 Thoracic aortic ectasia: Secondary | ICD-10-CM | POA: Diagnosis not present

## 2023-05-23 MED ORDER — ROSUVASTATIN CALCIUM 5 MG PO TABS
5.0000 mg | ORAL_TABLET | Freq: Every day | ORAL | 3 refills | Status: DC
  Filled 2023-08-16: qty 90, 90d supply, fill #0
  Filled 2024-03-30: qty 90, 90d supply, fill #1

## 2023-05-23 NOTE — Progress Notes (Signed)
ID:  Dashawn Karpinski, DOB 05-24-1971, MRN [id number]  PCP:  Noni Saupe, MD  Cardiologist:  Tessa Lerner, DO, [facility] (established care 01/25/23) Former Cardiology Providers: None  Date: 05/23/23 Last Office Visit: 04/19/2023  Chief Complaint  Patient presents with   Essential hypertension   Follow-up   Coronary Artery Disease    HPI  Jasmin Swanson is a 52 y.o.  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   Patient was referred to practice for evaluation of chest pain and blood pressure management.  Benign essential hypertension: Prior to establishing care patient has BP at home with ranging 160-170 mmHg.  With uptitration of medical therapy patient's blood pressures are now better controlled.  She checks it once a week and the SBP's are 130 mmHg on current medical therapy.  Most recent labs from May 17, 2023 independently reviewed and renal function and electrolytes are within limits.  Precordial pain: Precordial discomfort initially was suggestive of both cardiac and noncardiac symptoms.  She did undergo coronary calcium score which noted mild CAC, echocardiogram illustrated preserved LVEF without any obvious regional wall motion abnormalities.  Her GXT was inconclusive as the stress ECG was equivocal for ischemia as it was uninterpretable due to motion artifact.  At the last office visit the shared decision was to reevaluate her symptoms after blood pressure management.  Since last office visit patient states that she no longer is experiencing cardiac chest pain and like to hold off additional testing at this time.  Her overall physical activity is limited due to sciatic discomfort.  And she recently started Zepbound and has lost 6 pounds since last office visit.  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  ALLERGIES: No Known Allergies  MEDICATION LIST PRIOR TO VISIT: Current Meds  Medication Sig   celecoxib (CELEBREX) 200 MG capsule Take 200 mg by mouth daily.   losartan (COZAAR) 100 MG tablet Take 1 tablet (100 mg total) by mouth daily at 10 pm.   Oxycodone HCl 10 MG TABS Take 10 mg by mouth as needed.   pregabalin (LYRICA) 50 MG capsule Take 50 mg by mouth 3 (three) times daily.   rosuvastatin (CRESTOR) 5 MG tablet Take 1 tablet (5 mg total) by mouth daily.   Topiramate ER (TROKENDI XR) 200 MG CP24 Take by mouth.   Topiramate ER (TROKENDI XR) 200 MG CP24 Take 1 capsule (200 mg total) by mouth daily.     PAST MEDICAL HISTORY: Past Medical History:  Diagnosis Date   Coronary artery calcification    Hypertension    Migraine    Obesity (BMI 30-39.9)     PAST SURGICAL HISTORY: Past Surgical History:  Procedure Laterality Date   BACK SURGERY      FAMILY HISTORY: The patient family history includes Diabetes in her father and mother; Hypertension in her brother, father, mother, and sister.  SOCIAL HISTORY:  The patient  reports that she has never smoked. She has never used smokeless tobacco. She reports that she does not drink alcohol and does not use drugs.  REVIEW OF SYSTEMS: Review of Systems  Cardiovascular:  Negative for chest pain, claudication, dyspnea on exertion, irregular heartbeat, leg swelling, near-syncope, orthopnea, palpitations, paroxysmal nocturnal dyspnea and syncope.  Respiratory:  Negative for shortness of breath.   Hematologic/Lymphatic: Negative for bleeding problem.  Musculoskeletal:  Negative for muscle cramps and myalgias.  Neurological:  Negative for dizziness and light-headedness.    PHYSICAL EXAM:    05/23/2023    3:52 PM 04/19/2023   11:09 AM 04/19/2023   11:07 AM  Vitals with BMI  Height 5\' 7"   5\' 7"   Weight 243 lbs  249 lbs  BMI 38.05  38.99  Systolic 131 150 161  Diastolic 74 86 87  Pulse 78 64 66     Physical Exam  Constitutional: No distress.  Age appropriate, hemodynamically stable.   Neck: No JVD present.  Cardiovascular: Normal rate, regular rhythm, S1 normal, S2 normal, intact distal pulses and normal pulses. Exam reveals no gallop, no S3 and no S4.  No murmur heard. Pulmonary/Chest: Effort normal and breath sounds normal. No stridor. She has no wheezes. She has no rales.  Abdominal: Soft. Bowel sounds are normal. She exhibits no distension. There is no abdominal tenderness.  Musculoskeletal:        General: No edema.     Cervical back: Neck supple.  Neurological: She is alert and oriented to person, place, and time. She has intact cranial nerves (2-12).  Skin: Skin is warm and moist.   CARDIAC DATABASE: EKG: 01/25/2023: Sinus Rhythm 76bpm, without underlying injury or ischemia.  Echocardiogram: 04/11/2023:  Normal LV systolic function with visual EF 60-65%. Left ventricle cavity is normal in size. Normal global wall motion. Normal diastolic filling  pattern, normal LAP. Moderate left ventricular hypertrophy.  Mild (Grade I) mitral regurgitation.  No prior study for comparison.   Stress Testing: Exercise treadmill stress test 04/11/2023: Functional status: Poor.  Chest pain: No. Reason for stopping exercise: Fatigue/weakness Hypertensive response to exercise: Yes (rest 140/90, peak 200/110) Exercise time 2 minutes 58 seconds on Bruce protocol, achieved 4.64 METS, 86% of age-predicted maximum heart rate (APMHR).  Stress ECG equivocal for ischemia uninterpretable ST-T rate related/motion artifact.   If clinically indicated consider a different modality to evaluate for ischemia.   Heart Catheterization: None  CT Cardiac Scoring: 04/04/2023 Left main 0. LAD 36. LCx 0. RCA 0. 1. Coronary calcium score of 36. This was 94th percentile for age,gender, and race matched controls.  2. Evidence of mild ascending aortic dilation, 40 mm, onnon-contrasted study. Consider  secondary imaging modality (echocardiogram, CTA Aorta Protocol, MRA Aorta Protocol) if clinically indicated.  Noncardiac findings: No acute or unexpected extracardiac findings.   LABORATORY DATA:    Latest Ref Rng & Units 10/01/2020    5:48 PM  CBC  WBC 4.0 - 10.5 K/uL 7.1   Hemoglobin 12.0 - 15.0 g/dL 09.6   Hematocrit 04.5 - 46.0 % 36.9   Platelets 150 - 400 K/uL 433        Latest Ref Rng & Units 05/17/2023    8:41 AM 05/17/2023    8:39 AM 02/03/2023    2:38 PM  CMP  Glucose 70 - 99 mg/dL 97  96  409   BUN 6 - 24 mg/dL 17  17  13    Creatinine 0.57 - 1.00 mg/dL 8.11  9.14  7.82   Sodium 134 - 144 mmol/L 142  142  144   Potassium 3.5 - 5.2 mmol/L 3.9  3.8  4.1   Chloride 96 - 106 mmol/L 104  102  104   CO2 20 - 29 mmol/L 25  26  25    Calcium 8.7 - 10.2 mg/dL 9.7  9.5  9.8     Lipid Panel     Component Value  Date/Time   CHOL 166 05/17/2023 0841   TRIG 91 05/17/2023 0841   HDL 50 05/17/2023 0841   CHOLHDL 3.3 05/17/2023 0841   LDLCALC 99 05/17/2023 0841   LDLDIRECT 105 (H) 05/17/2023 0841   LABVLDL 17 05/17/2023 0841    No components found for: "NTPROBNP" No results for input(s): "PROBNP" in the last 8760 hours. No results for input(s): "TSH" in the last 8760 hours.  BMP Recent Labs    02/03/23 1438 05/17/23 0839 05/17/23 0841  NA 144 142 142  K 4.1 3.8 3.9  CL 104 102 104  CO2 25 26 25   GLUCOSE 114* 96 97  BUN 13 17 17   CREATININE 0.86 0.89 0.94  CALCIUM 9.8 9.5 9.7    HEMOGLOBIN A1C No results found for: "HGBA1C", "MPG"  External Labs [facility] - 12/30/2022 Glucose 94, BUN/Cr 17/0.9. EGFR 84. Na/K 142/3.9.  H/H 13.2/42.5. MCV 92. Platelets 206 Magnesium: 2.32  IMPRESSION:    ICD-10-CM   1. Agatston coronary artery calcium score less than 100  R93.1 rosuvastatin (CRESTOR) 5 MG tablet    Lipid Panel With LDL/HDL Ratio    LDL cholesterol, direct    Comp. Metabolic Panel (12)    2. Ascending aorta dilatation (HCC)  I77.810     3.  Essential hypertension  I10         RECOMMENDATIONS: Maylen Hamman is a 53 y.o.  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   Agatston coronary artery calcium score less than 100 Mild coronary artery calcification.  Total CAC 36, 94th percentile Shared decision was to start statin therapy.  Rosuvastatin 5 mg p.o. nightly with labs in 6 weeks to reevaluate lipids. Hopeful that as she uptitrates ZepBound and weight loss ensues her lipids should also improve. Echo: Preserved LVEF, see report for additional details. GXT was inconclusive as the stress ECG was uninterpretable. Currently denies anginal chest pain.  We discussed different modality to evaluate for ischemia; however, she would like to hold off on additional testing as she is asymptomatic.  Agreeable with the plan of care.  Reemphasized the importance of improving her modifiable cardiovascular risk factors.  Ascending aorta dilatation [facility]) June 2024: Coronary calcium score noted ascending aortic dilatation at 40 mm.  Follow-up CT scheduled for June 2025. Reemphasized the importance of blood pressure management  Essential hypertension Office and home blood pressures have improved. Continue current medical therapy. Reemphasized importance of low-salt diet. In the past we did discuss the role of sleep apnea and being evaluated but patient is not really inclined.  Labs from 05/17/2023 independently reviewed.   FINAL MEDICATION LIST END OF ENCOUNTER: Meds ordered this encounter  Medications   rosuvastatin (CRESTOR) 5 MG tablet    Sig: Take 1 tablet (5 mg total) by mouth daily.    Dispense:  90 tablet    Refill:  3    There are no discontinued medications.    Current Outpatient Medications:    celecoxib (CELEBREX) 200 MG capsule, Take 200 mg by mouth daily., Disp: , Rfl:     losartan (COZAAR) 100 MG tablet, Take 1 tablet (100 mg total) by mouth daily at 10 pm., Disp: 90 tablet, Rfl: 0   Oxycodone HCl 10 MG TABS, Take 10 mg by mouth as needed., Disp: , Rfl:    pregabalin (LYRICA) 50 MG capsule, Take 50 mg by mouth 3 (three) times  daily., Disp: , Rfl:    rosuvastatin (CRESTOR) 5 MG tablet, Take 1 tablet (5 mg total) by mouth daily., Disp: 90 tablet, Rfl: 3   Topiramate ER (TROKENDI XR) 200 MG CP24, Take by mouth., Disp: , Rfl:    Topiramate ER (TROKENDI XR) 200 MG CP24, Take 1 capsule (200 mg total) by mouth daily., Disp: 30 capsule, Rfl: 2   chlorthalidone (HYGROTON) 25 MG tablet, Take 0.5 tablets (12.5 mg total) by mouth in the morning., Disp: 30 tablet, Rfl: 0  Orders Placed This Encounter  Procedures   Lipid Panel With LDL/HDL Ratio   LDL cholesterol, direct   Comp. Metabolic Panel (12)    There are no Patient Instructions on file for this visit.   --Continue cardiac medications as reconciled in final medication list. --Return in about 6 months (around 11/23/2023) for Follow up, BP, Coronary artery calcification. or sooner if needed. --Continue follow-up with your primary care physician regarding the management of your other chronic comorbid conditions.  Patient's questions and concerns were addressed to her satisfaction. She voices understanding of the instructions provided during this encounter.   This note was created using a voice recognition software as a result there may be grammatical errors inadvertently enclosed that do not reflect the nature of this encounter. Every attempt is made to correct such errors.  Tessa Lerner, [place], [facility]  Pager:  [phone] Office: [phone]

## 2023-06-07 ENCOUNTER — Other Ambulatory Visit (HOSPITAL_BASED_OUTPATIENT_CLINIC_OR_DEPARTMENT_OTHER): Payer: Self-pay

## 2023-06-07 DIAGNOSIS — G894 Chronic pain syndrome: Secondary | ICD-10-CM | POA: Diagnosis not present

## 2023-06-07 DIAGNOSIS — M170 Bilateral primary osteoarthritis of knee: Secondary | ICD-10-CM | POA: Diagnosis not present

## 2023-06-07 DIAGNOSIS — M48061 Spinal stenosis, lumbar region without neurogenic claudication: Secondary | ICD-10-CM | POA: Diagnosis not present

## 2023-06-07 DIAGNOSIS — Z79891 Long term (current) use of opiate analgesic: Secondary | ICD-10-CM | POA: Diagnosis not present

## 2023-06-07 DIAGNOSIS — M47816 Spondylosis without myelopathy or radiculopathy, lumbar region: Secondary | ICD-10-CM | POA: Diagnosis not present

## 2023-06-07 MED ORDER — OXYCODONE HCL 10 MG PO TABS
10.0000 mg | ORAL_TABLET | Freq: Three times a day (TID) | ORAL | 0 refills | Status: DC | PRN
  Filled 2023-06-07 – 2023-06-23 (×2): qty 90, 30d supply, fill #0

## 2023-06-20 ENCOUNTER — Other Ambulatory Visit: Payer: Self-pay | Admitting: Cardiology

## 2023-06-20 DIAGNOSIS — I10 Essential (primary) hypertension: Secondary | ICD-10-CM

## 2023-06-23 ENCOUNTER — Other Ambulatory Visit (HOSPITAL_BASED_OUTPATIENT_CLINIC_OR_DEPARTMENT_OTHER): Payer: Self-pay

## 2023-06-23 MED ORDER — TOPIRAMATE ER 200 MG PO CAP24
1.0000 | ORAL_CAPSULE | Freq: Every day | ORAL | 0 refills | Status: DC
  Filled 2023-06-23 – 2023-08-01 (×2): qty 30, 30d supply, fill #0

## 2023-07-05 ENCOUNTER — Other Ambulatory Visit (HOSPITAL_BASED_OUTPATIENT_CLINIC_OR_DEPARTMENT_OTHER): Payer: Self-pay

## 2023-07-05 DIAGNOSIS — G894 Chronic pain syndrome: Secondary | ICD-10-CM | POA: Diagnosis not present

## 2023-07-05 DIAGNOSIS — M170 Bilateral primary osteoarthritis of knee: Secondary | ICD-10-CM | POA: Diagnosis not present

## 2023-07-05 DIAGNOSIS — M48061 Spinal stenosis, lumbar region without neurogenic claudication: Secondary | ICD-10-CM | POA: Diagnosis not present

## 2023-07-05 DIAGNOSIS — M47816 Spondylosis without myelopathy or radiculopathy, lumbar region: Secondary | ICD-10-CM | POA: Diagnosis not present

## 2023-07-05 MED ORDER — OXYCODONE HCL 10 MG PO TABS
10.0000 mg | ORAL_TABLET | Freq: Three times a day (TID) | ORAL | 0 refills | Status: DC | PRN
Start: 2023-07-05 — End: 2023-11-07
  Filled 2023-07-26: qty 90, 30d supply, fill #0

## 2023-07-05 MED ORDER — DICLOFENAC SODIUM 1 % EX GEL
4.0000 g | Freq: Four times a day (QID) | CUTANEOUS | 5 refills | Status: DC
  Filled 2023-07-05: qty 900, 54d supply, fill #0
  Filled 2023-07-26: qty 100, 5d supply, fill #0

## 2023-07-20 ENCOUNTER — Other Ambulatory Visit (HOSPITAL_BASED_OUTPATIENT_CLINIC_OR_DEPARTMENT_OTHER): Payer: Self-pay

## 2023-07-26 ENCOUNTER — Other Ambulatory Visit (HOSPITAL_BASED_OUTPATIENT_CLINIC_OR_DEPARTMENT_OTHER): Payer: Self-pay

## 2023-07-27 ENCOUNTER — Other Ambulatory Visit (HOSPITAL_BASED_OUTPATIENT_CLINIC_OR_DEPARTMENT_OTHER): Payer: Self-pay

## 2023-08-01 ENCOUNTER — Other Ambulatory Visit: Payer: Self-pay

## 2023-08-01 ENCOUNTER — Telehealth: Payer: Self-pay | Admitting: Cardiology

## 2023-08-01 ENCOUNTER — Other Ambulatory Visit: Payer: Self-pay | Admitting: Cardiology

## 2023-08-01 DIAGNOSIS — I10 Essential (primary) hypertension: Secondary | ICD-10-CM

## 2023-08-01 NOTE — Telephone Encounter (Signed)
*  STAT* If patient is at the pharmacy, call can be transferred to refill team.   1. Which medications need to be refilled? (please list name of each medication and dose if known) chlorthalidone (HYGROTON) 25 MG tablet   losartan (COZAAR) 100 MG tablet (Expired)   2. Which pharmacy/location (including street and city if local pharmacy) is medication to be sent to?  [facility] - [place], [place] - [address]    3. Do they need a 30 day or 90 day supply? 90

## 2023-08-02 ENCOUNTER — Other Ambulatory Visit (HOSPITAL_BASED_OUTPATIENT_CLINIC_OR_DEPARTMENT_OTHER): Payer: Self-pay

## 2023-08-02 ENCOUNTER — Other Ambulatory Visit: Payer: Self-pay

## 2023-08-02 MED ORDER — CHLORTHALIDONE 25 MG PO TABS
25.0000 mg | ORAL_TABLET | Freq: Every day | ORAL | 2 refills | Status: DC
Start: 2023-08-02 — End: 2023-11-22
  Filled 2023-08-03: qty 90, 90d supply, fill #0

## 2023-08-02 MED ORDER — LOSARTAN POTASSIUM 100 MG PO TABS
100.0000 mg | ORAL_TABLET | Freq: Every day | ORAL | 2 refills | Status: DC
Start: 2023-08-02 — End: 2023-11-22
  Filled 2023-08-03: qty 90, 90d supply, fill #0

## 2023-08-02 NOTE — Telephone Encounter (Signed)
Pt's medications were sent to pt's pharmacy as requested. Confirmation received.  

## 2023-08-03 ENCOUNTER — Other Ambulatory Visit: Payer: Self-pay

## 2023-08-03 ENCOUNTER — Other Ambulatory Visit (HOSPITAL_BASED_OUTPATIENT_CLINIC_OR_DEPARTMENT_OTHER): Payer: Self-pay

## 2023-08-05 ENCOUNTER — Other Ambulatory Visit (HOSPITAL_BASED_OUTPATIENT_CLINIC_OR_DEPARTMENT_OTHER): Payer: Self-pay

## 2023-08-09 ENCOUNTER — Other Ambulatory Visit (HOSPITAL_BASED_OUTPATIENT_CLINIC_OR_DEPARTMENT_OTHER): Payer: Self-pay

## 2023-08-09 MED ORDER — TOPIRAMATE ER 200 MG PO CAP24
200.0000 mg | ORAL_CAPSULE | Freq: Every day | ORAL | 0 refills | Status: DC
  Filled 2023-08-09: qty 30, 30d supply, fill #0

## 2023-08-15 ENCOUNTER — Other Ambulatory Visit (HOSPITAL_BASED_OUTPATIENT_CLINIC_OR_DEPARTMENT_OTHER): Payer: Self-pay

## 2023-08-16 ENCOUNTER — Other Ambulatory Visit (HOSPITAL_BASED_OUTPATIENT_CLINIC_OR_DEPARTMENT_OTHER): Payer: Self-pay

## 2023-08-16 DIAGNOSIS — M47816 Spondylosis without myelopathy or radiculopathy, lumbar region: Secondary | ICD-10-CM | POA: Diagnosis not present

## 2023-08-16 DIAGNOSIS — M170 Bilateral primary osteoarthritis of knee: Secondary | ICD-10-CM | POA: Diagnosis not present

## 2023-08-16 DIAGNOSIS — M48061 Spinal stenosis, lumbar region without neurogenic claudication: Secondary | ICD-10-CM | POA: Diagnosis not present

## 2023-08-16 DIAGNOSIS — G894 Chronic pain syndrome: Secondary | ICD-10-CM | POA: Diagnosis not present

## 2023-08-16 MED ORDER — SUTAB 1479-225-188 MG PO TABS
1.0000 | ORAL_TABLET | ORAL | 0 refills | Status: DC
  Filled 2023-08-16: qty 24, 1d supply, fill #0

## 2023-08-16 MED ORDER — OXYCODONE HCL 10 MG PO TABS
10.0000 mg | ORAL_TABLET | Freq: Three times a day (TID) | ORAL | 0 refills | Status: DC | PRN
  Filled 2023-08-26: qty 90, 30d supply, fill #0

## 2023-08-16 MED ORDER — MOUNJARO 2.5 MG/0.5ML ~~LOC~~ SOAJ: 2.5000 mg | SUBCUTANEOUS | 0 refills | Status: DC

## 2023-08-16 MED ORDER — OXYCODONE HCL 10 MG PO TABS
10.0000 mg | ORAL_TABLET | Freq: Three times a day (TID) | ORAL | 0 refills | Status: DC | PRN
  Filled 2023-09-27: qty 90, 30d supply, fill #0

## 2023-08-16 MED ORDER — PREGABALIN 50 MG PO CAPS
50.0000 mg | ORAL_CAPSULE | Freq: Two times a day (BID) | ORAL | 2 refills | Status: DC
  Filled 2023-08-16: qty 60, 30d supply, fill #0

## 2023-08-17 ENCOUNTER — Other Ambulatory Visit (HOSPITAL_BASED_OUTPATIENT_CLINIC_OR_DEPARTMENT_OTHER): Payer: Self-pay

## 2023-08-17 MED ORDER — ZEPBOUND 5 MG/0.5ML ~~LOC~~ SOAJ: 5.0000 mg | SUBCUTANEOUS | 0 refills | Status: DC

## 2023-08-17 MED ORDER — ZEPBOUND 2.5 MG/0.5ML ~~LOC~~ SOAJ: 2.5000 mg | SUBCUTANEOUS | 0 refills | Status: DC

## 2023-08-17 MED ORDER — ZEPBOUND 5 MG/0.5ML ~~LOC~~ SOAJ: 5.0000 mg | SUBCUTANEOUS | 1 refills | Status: DC

## 2023-08-18 ENCOUNTER — Other Ambulatory Visit (HOSPITAL_BASED_OUTPATIENT_CLINIC_OR_DEPARTMENT_OTHER): Payer: Self-pay

## 2023-08-19 ENCOUNTER — Other Ambulatory Visit (HOSPITAL_BASED_OUTPATIENT_CLINIC_OR_DEPARTMENT_OTHER): Payer: Self-pay

## 2023-08-22 DIAGNOSIS — D123 Benign neoplasm of transverse colon: Secondary | ICD-10-CM | POA: Diagnosis not present

## 2023-08-22 DIAGNOSIS — Z1211 Encounter for screening for malignant neoplasm of colon: Secondary | ICD-10-CM | POA: Diagnosis not present

## 2023-08-22 LAB — HM COLONOSCOPY

## 2023-08-25 ENCOUNTER — Other Ambulatory Visit (HOSPITAL_COMMUNITY): Payer: Self-pay

## 2023-08-26 ENCOUNTER — Other Ambulatory Visit (HOSPITAL_BASED_OUTPATIENT_CLINIC_OR_DEPARTMENT_OTHER): Payer: Self-pay

## 2023-09-27 ENCOUNTER — Other Ambulatory Visit (HOSPITAL_BASED_OUTPATIENT_CLINIC_OR_DEPARTMENT_OTHER): Payer: Self-pay

## 2023-10-11 ENCOUNTER — Ambulatory Visit: Payer: Self-pay | Admitting: Family Medicine

## 2023-10-11 ENCOUNTER — Other Ambulatory Visit (HOSPITAL_BASED_OUTPATIENT_CLINIC_OR_DEPARTMENT_OTHER): Payer: Self-pay

## 2023-10-11 DIAGNOSIS — M47816 Spondylosis without myelopathy or radiculopathy, lumbar region: Secondary | ICD-10-CM | POA: Diagnosis not present

## 2023-10-11 DIAGNOSIS — G894 Chronic pain syndrome: Secondary | ICD-10-CM | POA: Diagnosis not present

## 2023-10-11 DIAGNOSIS — Z79891 Long term (current) use of opiate analgesic: Secondary | ICD-10-CM | POA: Diagnosis not present

## 2023-10-11 DIAGNOSIS — M170 Bilateral primary osteoarthritis of knee: Secondary | ICD-10-CM | POA: Diagnosis not present

## 2023-10-11 DIAGNOSIS — M48061 Spinal stenosis, lumbar region without neurogenic claudication: Secondary | ICD-10-CM | POA: Diagnosis not present

## 2023-10-11 MED ORDER — OXYCODONE HCL 10 MG PO TABS
10.0000 mg | ORAL_TABLET | Freq: Three times a day (TID) | ORAL | 0 refills | Status: DC | PRN
  Filled 2023-10-11 – 2023-10-27 (×2): qty 90, 30d supply, fill #0

## 2023-10-11 MED ORDER — CELECOXIB 100 MG PO CAPS
100.0000 mg | ORAL_CAPSULE | Freq: Every day | ORAL | 0 refills | Status: AC | PRN
  Filled 2023-10-11 – 2023-10-27 (×2): qty 30, 30d supply, fill #0

## 2023-10-11 MED ORDER — OXYCODONE HCL 10 MG PO TABS: 10.0000 mg | ORAL_TABLET | Freq: Three times a day (TID) | ORAL | 0 refills | Status: DC | PRN

## 2023-10-14 DIAGNOSIS — Z79891 Long term (current) use of opiate analgesic: Secondary | ICD-10-CM | POA: Diagnosis not present

## 2023-10-14 DIAGNOSIS — G894 Chronic pain syndrome: Secondary | ICD-10-CM | POA: Diagnosis not present

## 2023-10-21 ENCOUNTER — Other Ambulatory Visit (HOSPITAL_BASED_OUTPATIENT_CLINIC_OR_DEPARTMENT_OTHER): Payer: Self-pay

## 2023-10-27 ENCOUNTER — Other Ambulatory Visit (HOSPITAL_BASED_OUTPATIENT_CLINIC_OR_DEPARTMENT_OTHER): Payer: Self-pay

## 2023-10-31 DIAGNOSIS — N950 Postmenopausal bleeding: Secondary | ICD-10-CM | POA: Diagnosis not present

## 2023-11-07 ENCOUNTER — Ambulatory Visit: Payer: Commercial Managed Care - PPO | Admitting: Family Medicine

## 2023-11-07 ENCOUNTER — Encounter: Payer: Self-pay | Admitting: Family Medicine

## 2023-11-07 ENCOUNTER — Other Ambulatory Visit (HOSPITAL_BASED_OUTPATIENT_CLINIC_OR_DEPARTMENT_OTHER): Payer: Self-pay

## 2023-11-07 VITALS — BP 138/82 | HR 76 | Temp 97.5°F | Ht 67.0 in | Wt 238.0 lb

## 2023-11-07 DIAGNOSIS — I2510 Atherosclerotic heart disease of native coronary artery without angina pectoris: Secondary | ICD-10-CM | POA: Diagnosis not present

## 2023-11-07 DIAGNOSIS — I2584 Coronary atherosclerosis due to calcified coronary lesion: Secondary | ICD-10-CM

## 2023-11-07 DIAGNOSIS — G43009 Migraine without aura, not intractable, without status migrainosus: Secondary | ICD-10-CM | POA: Diagnosis not present

## 2023-11-07 DIAGNOSIS — K8020 Calculus of gallbladder without cholecystitis without obstruction: Secondary | ICD-10-CM

## 2023-11-07 DIAGNOSIS — M545 Low back pain, unspecified: Secondary | ICD-10-CM | POA: Insufficient documentation

## 2023-11-07 DIAGNOSIS — R1011 Right upper quadrant pain: Secondary | ICD-10-CM

## 2023-11-07 DIAGNOSIS — I10 Essential (primary) hypertension: Secondary | ICD-10-CM | POA: Diagnosis not present

## 2023-11-07 DIAGNOSIS — G8929 Other chronic pain: Secondary | ICD-10-CM | POA: Diagnosis not present

## 2023-11-07 DIAGNOSIS — I77810 Thoracic aortic ectasia: Secondary | ICD-10-CM | POA: Diagnosis not present

## 2023-11-07 MED ORDER — TOPIRAMATE ER 200 MG PO CAP24
1.0000 | ORAL_CAPSULE | Freq: Every day | ORAL | 3 refills | Status: AC
  Filled 2023-11-07 – 2023-11-24 (×2): qty 90, 90d supply, fill #0
  Filled 2024-02-28 (×2): qty 90, 90d supply, fill #1
  Filled 2024-05-24: qty 90, 90d supply, fill #2
  Filled 2024-09-10 (×2): qty 90, 90d supply, fill #3

## 2023-11-07 MED ORDER — ONDANSETRON 4 MG PO TBDP
4.0000 mg | ORAL_TABLET | Freq: Three times a day (TID) | ORAL | 0 refills | Status: AC | PRN
  Filled 2023-11-07 – 2023-11-24 (×2): qty 20, 7d supply, fill #0

## 2023-11-07 NOTE — Assessment & Plan Note (Signed)
 Intermittent right upper quadrant pain; known history of gallstones. Symptoms persisted after discontinuing Zepbound .  Plan: Refer to general surgery for evaluation and management of gallstones. Educate on signs of acute cholecystitis; advise immediate care if symptoms worsen.

## 2023-11-07 NOTE — Assessment & Plan Note (Addendum)
 Stable on topiramate .  Plan: Refill topiramate  extended-release 200?mg daily. Prescribe ondansetron  4?mg as needed for nausea during migraine episodes. Emphasize importance of medication adherence. Monitor migraine frequency and severity; return to clinic if symptoms worsen.

## 2023-11-07 NOTE — Progress Notes (Signed)
 Assessment/Plan:   Problem List Items Addressed This Visit       Cardiovascular and Mediastinum   Migraine, unspecified, not intractable, without status migrainosus - Primary   Stable on topiramate .  Plan: Refill topiramate  extended-release 200?mg daily. Prescribe ondansetron  4?mg as needed for nausea during migraine episodes. Emphasize importance of medication adherence. Monitor migraine frequency and severity; return to clinic if symptoms worsen.      Relevant Medications   Topiramate  ER (TROKENDI  XR) 200 MG CP24   ondansetron  (ZOFRAN -ODT) 4 MG disintegrating tablet   Primary hypertension   Controlled on current regimen.  Plan: Continue losartan  100?mg daily and chlorthalidone  12.5 mg. Schedule fasting lab work to assess renal function and electrolytes. Reevaluate blood pressure control at next visit.       Ascending aorta dilatation (HCC)   Coronary artery disease due to calcified coronary lesion   Continue rosuvastatin  5?mg on Monday, Wednesday, and Friday.        Digestive   Gall stones     Other   RUQ pain   Intermittent right upper quadrant pain; known history of gallstones. Symptoms persisted after discontinuing Zepbound .  Plan: Refer to general surgery for evaluation and management of gallstones. Educate on signs of acute cholecystitis; advise immediate care if symptoms worsen.       Relevant Orders   Ambulatory referral to General Surgery   Chronic low back pain   Stable  Plan:  Continue current pain management in coordination with pain specialist.      Relevant Medications   Topiramate  ER (TROKENDI  XR) 200 MG CP24    Medications Discontinued During This Encounter  Medication Reason   Topiramate  ER (TROKENDI  XR) 200 MG CP24    Topiramate  ER (TROKENDI  XR) 200 MG CP24    celecoxib  (CELEBREX ) 200 MG capsule    diclofenac  Sodium (VOLTAREN ) 1 % GEL    Oxycodone  HCl 10 MG TABS    Oxycodone  HCl 10 MG TABS    Oxycodone  HCl 10 MG TABS     Oxycodone  HCl 10 MG TABS    Oxycodone  HCl 10 MG TABS    Sodium Sulfate-Mag Sulfate-KCl (SUTAB ) [phone] MG TABS    tirzepatide  (MOUNJARO ) 2.5 MG/0.5ML Pen    pregabalin  (LYRICA ) 50 MG capsule    tirzepatide  (ZEPBOUND ) 2.5 MG/0.5ML Pen    tirzepatide  (ZEPBOUND ) 5 MG/0.5ML Pen    tirzepatide  (ZEPBOUND ) 5 MG/0.5ML Pen    Topiramate  ER (TROKENDI  XR) 200 MG CP24     Return in about 4 weeks (around 12/05/2023) for physical (fasting labs).    Subjective:   Encounter date: 11/07/2023  Jasmin Swanson is a 53 y.o. female who has Migraine, unspecified, not intractable, without status migrainosus; Primary hypertension; RUQ pain; Gall stones; Ascending aorta dilatation (HCC); Coronary artery disease due to calcified coronary lesion; and Chronic low back pain on their problem list..   She  has a past medical history of Coronary artery calcification, Hypertension, Migraine, and Obesity (BMI 30-39.9).[name]   Chief Complaint: Establish care for migraines and right upper quadrant pain.  History of Present Illness:  Migraines: Patient presents to establish care with concerns about migraines. They have a history of migraine headaches managed with topiramate  extended-release 200?mg daily. Patient was without medication for approximately one month but recently restarted after finding leftover doses. When not taking topiramate , they experience migraine attacks. The last episode occurred two weeks ago, lasting 48 hours and accompanied by nausea. Patient does not currently use abortive medications or treatments for nausea during migraine episodes.  Right Upper Quadrant Pain: Patient reports intermittent discomfort in the right upper quadrant. They have a known history of gallstones. Previously took Zepbound  for weight management but discontinued use two months ago. Pain was worse while on Zepbound  but persists after discontinuation. Pain is random and not associated with meals. Denies nausea and vomiting. Patient  is interested in obtaining an ultrasound.  Hypertension and Hyperlipidemia: Since turning 50, patient has experienced elevated blood pressure. Currently managed with losartan  100?mg daily and chlorthalidone  12.5 mg. They reduced the losartan  dose by half due to low blood pressure readings but resumed the full dose after readings increased to the 130s. Patient has not had lab work in over a year.  Chest pain I's neck: Previously experienced chest pain, prompting cardiology evaluation. Attempted treadmill stress test (incomplete due to back pain). Chest pain has since resolved. Cholesterol levels were previously normal, but a CT coronary calcium  score was slightly elevated for their age.  Cardiology initiated rosuvastatin  5?mg but experienced increased body pain, leading to a reduced dosing frequency of three times weekly (Monday, Wednesday, Friday).  Chronic Back Pain: Patient has chronic back pain managed by a pain specialist. Medications include oxycodone  10?mg up to three times daily as needed, pregabalin  50?mg twice daily, and celecoxib  100?mg as needed (once every 2-3 weeks).  Review of Systems:  Constitutional: Denies fever, weight changes. Cardiovascular: History of hypertension; denies current chest pain; no edema. Pulmonary: Denies shortness of breath. Gastrointestinal: Reports intermittent right upper quadrant pain; denies nausea, vomiting, changes in bowel habits. Neurological: Reports migraines when off topiramate , associated with nausea; denies visual disturbances or focal deficits. Musculoskeletal: Reports increased body pain with rosuvastatin  use; chronic back pain. Psychiatric: Denies mood changes, anxiety, or depression. All other systems: Reviewed and negative.  Past Surgical History:  Procedure Laterality Date   CESAREAN SECTION  2003   CESAREAN SECTION  2006    Outpatient Medications Prior to Visit  Medication Sig Dispense Refill   celecoxib  (CELEBREX ) 100 MG capsule  Take 1 capsule (100 mg total) by mouth daily as needed. 30 capsule 0   chlorthalidone  (HYGROTON ) 25 MG tablet Take 1 tablet (25 mg total) by mouth daily. (Patient taking differently: Take 25 mg by mouth daily. Pt reports half a tablet) 90 tablet 2   losartan  (COZAAR ) 100 MG tablet Take 1 tablet (100 mg total) by mouth daily at 10 pm. 90 tablet 2   Oxycodone  HCl 10 MG TABS Take 10 mg by mouth as needed.     pregabalin  (LYRICA ) 50 MG capsule Take 1 capsule (50 mg total) by mouth 2 (two) times daily as needed. 60 capsule 2   rosuvastatin  (CRESTOR ) 5 MG tablet Take 1 tablet (5 mg total) by mouth daily. (Patient taking differently: Take 5 mg by mouth daily. Patient reports taking MWF) 90 tablet 3   Topiramate  ER (TROKENDI  XR) 200 MG CP24 Take by mouth.     celecoxib  (CELEBREX ) 200 MG capsule Take 200 mg by mouth daily. (Patient not taking: Reported on 11/07/2023)     diclofenac  Sodium (VOLTAREN ) 1 % GEL Apply 4 grams topically to affected area 4 (four) times daily. (Patient not taking: Reported on 11/07/2023) 900 g 5   Oxycodone  HCl 10 MG TABS Take 1 tablet (10 mg total) by mouth 3 (three) times daily as needed. (Patient not taking: Reported on 11/07/2023) 90 tablet 0   Oxycodone  HCl 10 MG TABS Take 1 tablet (10 mg total) by mouth 3 (three) times daily as needed. (Patient not taking:  Reported on 11/07/2023) 90 tablet 0   Oxycodone  HCl 10 MG TABS Take 1 tablet by mouth three times a day as needed (Patient not taking: Reported on 11/07/2023) 90 tablet 0   [START ON 11/09/2023] Oxycodone  HCl 10 MG TABS Take 1 tablet (10 mg total) by mouth 3 (three) times daily as needed. (Patient not taking: Reported on 11/07/2023) 90 tablet 0   Oxycodone  HCl 10 MG TABS Take 1 tablet (10 mg total) by mouth 3 (three) times daily as needed. (Patient not taking: Reported on 11/07/2023) 90 tablet 0   pregabalin  (LYRICA ) 50 MG capsule Take 50 mg by mouth 3 (three) times daily. (Patient not taking: Reported on 11/07/2023)     Sodium  Sulfate-Mag Sulfate-KCl (SUTAB ) [phone] MG TABS Take as directed. (Patient not taking: Reported on 11/07/2023) 24 tablet 0   tirzepatide  (MOUNJARO ) 2.5 MG/0.5ML Pen Inject 2.5 mg into the skin once a week. (Patient not taking: Reported on 11/07/2023) 2 mL 0   tirzepatide  (ZEPBOUND ) 2.5 MG/0.5ML Pen Inject 2.5 mg into the skin once a week. (Patient not taking: Reported on 11/07/2023) 4 mL 0   tirzepatide  (ZEPBOUND ) 5 MG/0.5ML Pen Inject 5 mg into the skin once a week. (Patient not taking: Reported on 11/07/2023) 2 mL 0   tirzepatide  (ZEPBOUND ) 5 MG/0.5ML Pen Inject 5 mg into the skin once a week. (Patient not taking: Reported on 11/07/2023) 2 mL 1   Topiramate  ER (TROKENDI  XR) 200 MG CP24 Take 1 capsule (200 mg total) by mouth daily. (Patient not taking: Reported on 11/07/2023) 30 capsule 0   Topiramate  ER (TROKENDI  XR) 200 MG CP24 Take 1 capsule (200 mg total) by mouth daily. MUST SCHEDULE APPOINTMENT FOR FURTHER REFILLS (Patient not taking: Reported on 11/07/2023) 30 capsule 0   No facility-administered medications prior to visit.    Family History  Problem Relation Age of Onset   Hypertension Mother    Diabetes Mother    Diabetes Father    Hypertension Father    Hypertension Sister    Hypertension Brother     Social History   Socioeconomic History   Marital status: Married    Spouse name: Not on file   Number of children: Not on file   Years of education: Not on file   Highest education level: Not on file  Occupational History   Not on file  Tobacco Use   Smoking status: Never    Passive exposure: Never   Smokeless tobacco: Never  Vaping Use   Vaping status: Never Used  Substance and Sexual Activity   Alcohol use: Never   Drug use: Never   Sexual activity: Not on file  Other Topics Concern   Not on file  Social History Narrative   Not on file   Social Drivers of Health   Financial Resource Strain: Not on file  Food Insecurity: Not on file  Transportation Needs: Not  on file  Physical Activity: Not on file  Stress: Not on file  Social Connections: Not on file  Intimate Partner Violence: Not on file  Objective:  Physical Exam: BP 138/82   Pulse 76   Temp (!) 97.5 F (36.4 C) (Temporal)   Ht 5' 7 (1.702 m)   Wt 238 lb (108 kg)   SpO2 97%   BMI 37.28 kg/m     Physical Exam Constitutional:      General: She is not in acute distress.    Appearance: Normal appearance. She is not ill-appearing or toxic-appearing.  HENT:     Head: Normocephalic and atraumatic.     Nose: Nose normal. No congestion.  Eyes:     General: No scleral icterus.    Extraocular Movements: Extraocular movements intact.  Cardiovascular:     Rate and Rhythm: Normal rate and regular rhythm.     Pulses: Normal pulses.     Heart sounds: Normal heart sounds.  Pulmonary:     Effort: Pulmonary effort is normal. No respiratory distress.     Breath sounds: Normal breath sounds.  Abdominal:     General: Abdomen is flat. Bowel sounds are normal.     Palpations: Abdomen is soft.  Musculoskeletal:        General: Normal range of motion.  Lymphadenopathy:     Cervical: No cervical adenopathy.  Skin:    General: Skin is warm and dry.     Findings: No rash.  Neurological:     General: No focal deficit present.     Mental Status: She is alert and oriented to person, place, and time. Mental status is at baseline.  Psychiatric:        Mood and Affect: Mood normal.        Behavior: Behavior normal.        Thought Content: Thought content normal.        Judgment: Judgment normal.     No results found.  No results found for this or any previous visit (from the past 2160 hours).      Beverley Adine Hummer, MD, MS

## 2023-11-07 NOTE — Assessment & Plan Note (Signed)
 Stable  Plan:  Continue current pain management in coordination with pain specialist.

## 2023-11-07 NOTE — Assessment & Plan Note (Signed)
 Continue rosuvastatin 5?mg on Monday, Wednesday, and Friday.

## 2023-11-07 NOTE — Assessment & Plan Note (Signed)
 Controlled on current regimen.  Plan: Continue losartan 100?mg daily and chlorthalidone 12.5 mg. Schedule fasting lab work to assess renal function and electrolytes. Reevaluate blood pressure control at next visit.

## 2023-11-08 ENCOUNTER — Other Ambulatory Visit (HOSPITAL_BASED_OUTPATIENT_CLINIC_OR_DEPARTMENT_OTHER): Payer: Self-pay

## 2023-11-18 ENCOUNTER — Other Ambulatory Visit (HOSPITAL_BASED_OUTPATIENT_CLINIC_OR_DEPARTMENT_OTHER): Payer: Self-pay

## 2023-11-18 ENCOUNTER — Other Ambulatory Visit: Payer: Self-pay | Admitting: Surgery

## 2023-11-18 DIAGNOSIS — R1011 Right upper quadrant pain: Secondary | ICD-10-CM | POA: Diagnosis not present

## 2023-11-21 ENCOUNTER — Other Ambulatory Visit: Payer: Self-pay

## 2023-11-21 DIAGNOSIS — R931 Abnormal findings on diagnostic imaging of heart and coronary circulation: Secondary | ICD-10-CM | POA: Diagnosis not present

## 2023-11-22 ENCOUNTER — Other Ambulatory Visit (HOSPITAL_BASED_OUTPATIENT_CLINIC_OR_DEPARTMENT_OTHER): Payer: Self-pay

## 2023-11-22 ENCOUNTER — Ambulatory Visit: Payer: Commercial Managed Care - PPO | Attending: Cardiology | Admitting: Cardiology

## 2023-11-22 ENCOUNTER — Encounter: Payer: Self-pay | Admitting: Cardiology

## 2023-11-22 VITALS — BP 114/84 | HR 83 | Resp 16 | Ht 67.0 in | Wt 240.2 lb

## 2023-11-22 DIAGNOSIS — E782 Mixed hyperlipidemia: Secondary | ICD-10-CM

## 2023-11-22 DIAGNOSIS — I10 Essential (primary) hypertension: Secondary | ICD-10-CM

## 2023-11-22 DIAGNOSIS — R931 Abnormal findings on diagnostic imaging of heart and coronary circulation: Secondary | ICD-10-CM | POA: Diagnosis not present

## 2023-11-22 DIAGNOSIS — I77810 Thoracic aortic ectasia: Secondary | ICD-10-CM | POA: Diagnosis not present

## 2023-11-22 LAB — LIPID PANEL WITH LDL/HDL RATIO
Cholesterol, Total: 141 mg/dL (ref 100–199)
HDL: 51 mg/dL (ref >39)
LDL Chol Calc (NIH): 74 mg/dL (ref 0–99)
LDL/HDL Ratio: 1.5 {ratio} (ref 0.0–3.2)
Triglycerides: 86 mg/dL (ref 0–149)
VLDL Cholesterol Cal: 16 mg/dL (ref 5–40)

## 2023-11-22 LAB — LDL CHOLESTEROL, DIRECT: LDL Direct: 78 mg/dL (ref 0–99)

## 2023-11-22 LAB — COMP. METABOLIC PANEL (12)
AST: 20 [IU]/L (ref 0–40)
Albumin: 4.5 g/dL (ref 3.8–4.9)
Alkaline Phosphatase: 64 [IU]/L (ref 44–121)
BUN/Creatinine Ratio: 11 (ref 9–23)
BUN: 9 mg/dL (ref 6–24)
Bilirubin Total: 0.5 mg/dL (ref 0.0–1.2)
Calcium: 10.1 mg/dL (ref 8.7–10.2)
Chloride: 102 mmol/L (ref 96–106)
Creatinine, Ser: 0.83 mg/dL (ref 0.57–1.00)
Globulin, Total: 2.8 g/dL (ref 1.5–4.5)
Glucose: 117 mg/dL — ABNORMAL HIGH (ref 70–99)
Potassium: 4.1 mmol/L (ref 3.5–5.2)
Sodium: 144 mmol/L (ref 134–144)
Total Protein: 7.3 g/dL (ref 6.0–8.5)
eGFR: 85 mL/min/{1.73_m2} (ref >59)

## 2023-11-22 MED ORDER — CHLORTHALIDONE 25 MG PO TABS
12.5000 mg | ORAL_TABLET | Freq: Every day | ORAL | 3 refills | Status: AC
  Filled 2023-11-22: qty 45, 90d supply, fill #0
  Filled 2024-05-24: qty 45, 90d supply, fill #1
  Filled 2024-09-10: qty 45, 90d supply, fill #2

## 2023-11-22 MED ORDER — LOSARTAN POTASSIUM 100 MG PO TABS
100.0000 mg | ORAL_TABLET | Freq: Every day | ORAL | 3 refills | Status: DC
  Filled 2023-11-22: qty 90, 90d supply, fill #0
  Filled 2024-03-30: qty 90, 90d supply, fill #1
  Filled 2024-06-26: qty 90, 90d supply, fill #2

## 2023-11-22 NOTE — Progress Notes (Signed)
Cardiology Office Note:  .   Date:  11/22/2023  ID:  Jasmin Swanson, DOB [birth year], MRN [id number] PCP:  Garnette Gunner, MD  Former Cardiology Providers: None [facility] Providers Cardiologist:  Tessa Lerner, DO , [facility] (established care 02/12/2023) Electrophysiologist:  None  Click to update primary MD,subspecialty MD or APP then REFRESH:1}    Chief Complaint  Patient presents with   Agatston coronary artery calcium score less than 100   Follow-up    History of Present Illness: .   Jasmin Swanson is a 53 y.o.  female whose past medical history and cardiovascular risk factors includes: hypertension, mild coronary artery calcification, ascending aortic dilatation 40 mm (June 2024), obesity due to excess calories.   Patient was referred to the practice for evaluation of chest pain and blood pressure management.  Benign essential hypertension: Prior to establishing care her blood pressures would range between 160-170 mmHg and without titration of antihypertensive medications, lifestyle changes, her blood pressures have improved significantly.  Precordial pain: Her initial symptoms of chest pain felt to be both cardiac and noncardiac.  She did undergo coronary calcium score which noted mild CAC, echocardiogram illustrated preserved LVEF without any significant valvular heart disease or wall motion abnormalities.  GXT was negative exclusive of the stress ECG was equivocal for ischemia as it was uninterpretable due to motion artifact.  At the last office visit shared decision was to hold off on ischemic workup until her blood pressure is within acceptable limits.  Patient presents today for follow-up.  Over the last 6 months she is doing well from a cardiovascular standpoint.  Her blood pressures are now much better on current medical therapy.  She is trying to be more active and focused on losing weight.  She wants to avoid pharmacological therapy to help facilitate weight loss at this  time.  She is no longer experiencing chest pain.  Labs from 11/21/2023 independently reviewed.   Review of Systems: .   Review of Systems  Cardiovascular:  Negative for chest pain, claudication, irregular heartbeat, leg swelling, near-syncope, orthopnea, palpitations, paroxysmal nocturnal dyspnea and syncope.  Respiratory:  Negative for shortness of breath.   Hematologic/Lymphatic: Negative for bleeding problem.    Studies Reviewed:   EKG: EKG Interpretation Date/Time:  Tuesday November 22 2023 10:21:29 EST Ventricular Rate:  72 PR Interval:  182 QRS Duration:  86 QT Interval:  386 QTC Calculation: 422 R Axis:   -2  Text Interpretation: Normal sinus rhythm Normal ECG When compared with ECG of 01-Oct-2020 14:52, No significant change was found Confirmed by Tessa Lerner [phone]) on 11/22/2023 10:50:25 AM  Echocardiogram: 04/11/2023:  Normal LV systolic function with visual EF 60-65%. Left ventricle cavity is normal in size. Normal global wall motion. Normal diastolic filling  pattern, normal LAP. Moderate left ventricular hypertrophy.  Mild (Grade I) mitral regurgitation.  No prior study for comparison.   Stress Testing: Exercise treadmill stress test 04/11/2023: Functional status: Poor.  Chest pain: No. Reason for stopping exercise: Fatigue/weakness Hypertensive response to exercise: Yes (rest 140/90, peak 200/110) Exercise time 2 minutes 58 seconds on Bruce protocol, achieved 4.64 METS, 86% of age-predicted maximum heart rate (APMHR).  Stress ECG equivocal for ischemia uninterpretable ST-T rate related/motion artifact.   If clinically indicated consider a different modality to evaluate for ischemia.  CT Cardiac Scoring: 04/04/2023 Left main 0. LAD 36. LCx 0. RCA 0. 1. Coronary calcium score of 36. This was 94th percentile for age,gender, and race matched controls.  2. Evidence  of mild ascending aortic dilation, 40 mm, onnon-contrasted study. Consider secondary imaging  modality (echocardiogram, CTA Aorta Protocol, MRA Aorta Protocol) if clinically indicated.  Noncardiac findings: No acute or unexpected extracardiac findings.   RADIOLOGY: NA  Risk Assessment/Calculations:   N/A   Labs:       Latest Ref Rng & Units 10/01/2020    5:48 PM  CBC  WBC 4.0 - 10.5 K/uL 7.1   Hemoglobin 12.0 - 15.0 g/dL 87.5   Hematocrit 64.3 - 46.0 % 36.9   Platelets 150 - 400 K/uL 433        Latest Ref Rng & Units 11/21/2023    3:50 PM 05/17/2023    8:41 AM 05/17/2023    8:39 AM  BMP  Glucose 70 - 99 mg/dL 329  97  96   BUN 6 - 24 mg/dL 9  17  17    Creatinine 0.57 - 1.00 mg/dL 5.18  8.41  6.60   BUN/Creat Ratio 9 - 23 11  18  19    Sodium 134 - 144 mmol/L 144  142  142   Potassium 3.5 - 5.2 mmol/L 4.1  3.9  3.8   Chloride 96 - 106 mmol/L 102  104  102   CO2 20 - 29 mmol/L  25  26   Calcium 8.7 - 10.2 mg/dL 63.0  9.7  9.5       Latest Ref Rng & Units 11/21/2023    3:50 PM 05/17/2023    8:41 AM 05/17/2023    8:39 AM  CMP  Glucose 70 - 99 mg/dL 160  97  96   BUN 6 - 24 mg/dL 9  17  17    Creatinine 0.57 - 1.00 mg/dL 1.09  3.23  5.57   Sodium 134 - 144 mmol/L 144  142  142   Potassium 3.5 - 5.2 mmol/L 4.1  3.9  3.8   Chloride 96 - 106 mmol/L 102  104  102   CO2 20 - 29 mmol/L  25  26   Calcium 8.7 - 10.2 mg/dL 32.2  9.7  9.5   Total Protein 6.0 - 8.5 g/dL 7.3     Total Bilirubin 0.0 - 1.2 mg/dL 0.5     Alkaline Phos 44 - 121 IU/L 64     AST 0 - 40 IU/L 20       Lab Results  Component Value Date   CHOL 141 11/21/2023   HDL 51 11/21/2023   LDLCALC 74 11/21/2023   LDLDIRECT 78 11/21/2023   TRIG 86 11/21/2023   CHOLHDL 3.3 05/17/2023   No results for input(s): "LIPOA" in the last 8760 hours. No components found for: "NTPROBNP" No results for input(s): "PROBNP" in the last 8760 hours. No results for input(s): "TSH" in the last 8760 hours.  Physical Exam:    Today's Vitals   11/22/23 1017  BP: 114/84  Pulse: 83  Resp: 16  SpO2: 93%  Weight: 240  lb 3.2 oz (109 kg)  Height: 5\' 7"  (1.702 m)   Body mass index is 37.62 kg/m. Wt Readings from Last 3 Encounters:  11/22/23 240 lb 3.2 oz (109 kg)  11/07/23 238 lb (108 kg)  05/23/23 243 lb (110.2 kg)    Physical Exam  Constitutional: No distress.  hemodynamically stable  Neck: No JVD present.  Cardiovascular: Normal rate, regular rhythm, S1 normal and S2 normal. Exam reveals no gallop, no S3 and no S4.  No murmur heard. Pulmonary/Chest: Effort normal and breath sounds normal.  No stridor. She has no wheezes. She has no rales.  Abdominal: Soft. Bowel sounds are normal. She exhibits no distension. There is no abdominal tenderness.  Musculoskeletal:        General: No edema.     Cervical back: Neck supple.  Neurological: She is alert and oriented to person, place, and time. She has intact cranial nerves (2-12).  Skin: Skin is warm.   Impression & Recommendation(s):  Impression:   ICD-10-CM   1. Agatston coronary artery calcium score less than 100  R93.1 EKG 12-Lead    2. Essential hypertension  I10 chlorthalidone (HYGROTON) 25 MG tablet    losartan (COZAAR) 100 MG tablet    3. Ascending aorta dilatation (HCC)  I77.810     4. Mixed hyperlipidemia  E78.2        Recommendation(s):  Agatston coronary artery calcium score less than 100 Total CAC 36, 94th percentile Denies any anginal chest pain or heart failure symptoms. EKG is nonischemic. Echo: Preserved LVEF, see report for additional details. GXT was inconclusive as the stress ECG was uninterpretable. Slowly improving her physical activity with the intentions of losing weight and getting healthier. Most recent labs from 11/21/2023 independently reviewed. In the past that shared decision was to improve her blood pressures and once her optimized to undergo further ischemic workup.  Her blood pressures are now very well-controlled on current medical therapy but since she is asymptomatic the shared decision is to hold off on  additional cardiovascular testing at this time.  Essential hypertension Prior to establishing care her SBP would range between 160-170 mmHg. Now home blood pressures are very well-controlled. Continue chlorthalidone 12.5 mg p.o. every morning. Continue losartan 100 mg p.o. every afternoon.  Ascending aorta dilatation [facility]) June 2024: Coronary calcium score noted ascending aortic dilatation at 40 mm.  Follow-up CT scheduled for June 2025. Recommend a goal SBP <120 mmHg if able to tolerate.  Mixed hyperlipidemia Currently on rosuvastatin 5 mg Monday Wednesday and Friday.   She denies myalgia or other side effects. Most recent lipids dated 11/21/2023, independently reviewed as noted above.  LDL is 78 mg/dL.   Orders Placed:  Orders Placed This Encounter  Procedures   EKG 12-Lead   As part of today's office visit we discussed management of at least 2 chronic comorbid conditions, EKG ordered and independently reviewed, labs from 11/21/2023 independently reviewed, reviewed prior echo and GXT results, we discussed management of her chronic comorbid conditions, and a shared decision was to hold off on ischemic workup at this time as clinically she has improved after lifestyle changes and blood pressure management.  Final Medication List:    Meds ordered this encounter  Medications   chlorthalidone (HYGROTON) 25 MG tablet    Sig: Take 0.5 tablets (12.5 mg total) by mouth daily. Pt reports half a tablet    Dispense:  90 tablet    Refill:  3   losartan (COZAAR) 100 MG tablet    Sig: Take 1 tablet (100 mg total) by mouth daily at 10 pm.    Dispense:  90 tablet    Refill:  3    Medications Discontinued During This Encounter  Medication Reason   chlorthalidone (HYGROTON) 25 MG tablet Reorder   losartan (COZAAR) 100 MG tablet Reorder     Current Outpatient Medications:    celecoxib (CELEBREX) 100 MG capsule, Take 1 capsule (100 mg total) by mouth daily as needed., Disp: 30 capsule, Rfl:  0   ondansetron (ZOFRAN-ODT) 4 MG disintegrating  tablet, Take 1 tablet (4 mg total) by mouth every 8 (eight) hours as needed for nausea or vomiting., Disp: 20 tablet, Rfl: 0   Oxycodone HCl 10 MG TABS, Take 10 mg by mouth as needed., Disp: , Rfl:    pregabalin (LYRICA) 50 MG capsule, Take 1 capsule (50 mg total) by mouth 2 (two) times daily as needed., Disp: 60 capsule, Rfl: 2   rosuvastatin (CRESTOR) 5 MG tablet, Take 1 tablet (5 mg total) by mouth daily. (Patient taking differently: Take 5 mg by mouth daily. Patient reports taking MWF), Disp: 90 tablet, Rfl: 3   Topiramate ER (TROKENDI XR) 200 MG CP24, Take 1 capsule (200 mg total) by mouth daily at 12 noon., Disp: 90 capsule, Rfl: 3   chlorthalidone (HYGROTON) 25 MG tablet, Take 0.5 tablets (12.5 mg total) by mouth daily. Pt reports half a tablet, Disp: 90 tablet, Rfl: 3   losartan (COZAAR) 100 MG tablet, Take 1 tablet (100 mg total) by mouth daily at 10 pm., Disp: 90 tablet, Rfl: 3  Consent:   NA  Disposition:   1 year follow-up sooner if needed  Her questions and concerns were addressed to her satisfaction. She voices understanding of the recommendations provided during this encounter.    Signed, Tessa Lerner, DO, [facility]  [facility]  [address] [place], [place] [address] 11/22/2023 11:15 AM

## 2023-11-22 NOTE — Patient Instructions (Addendum)
Medication Instructions:  Refilled Losartan 100 mg  Changed Chlorthalidone from 25 mg to 12.5 mg.   *If you need a refill on your cardiac medications before your next appointment, please call your pharmacy*  Follow-Up: At [facility], you and your health needs are our priority.  As part of our continuing mission to provide you with exceptional heart care, we have created designated Provider Care Teams.  These Care Teams include your primary Cardiologist (physician) and Advanced Practice Providers (APPs -  Physician Assistants and Nurse Practitioners) who all work together to provide you with the care you need, when you need it.  Your next appointment:   1 year(s)  Provider:   Tessa Lerner, DO     Other Instructions   1st Floor: - Lobby - Registration  - Pharmacy  - Lab - Cafe  2nd Floor: - PV Lab - Diagnostic Testing (echo, CT, nuclear med)  3rd Floor: - Vacant  4th Floor: - TCTS (cardiothoracic surgery) - AFib Clinic - Structural Heart Clinic - Vascular Surgery  - Vascular Ultrasound  5th Floor: - HeartCare Cardiology (general and EP) - Clinical Pharmacy for coumadin, hypertension, lipid, weight-loss medications, and med management appointments    Valet parking services will be available as well.

## 2023-11-24 ENCOUNTER — Other Ambulatory Visit (HOSPITAL_BASED_OUTPATIENT_CLINIC_OR_DEPARTMENT_OTHER): Payer: Self-pay

## 2023-11-28 ENCOUNTER — Other Ambulatory Visit: Payer: Commercial Managed Care - PPO

## 2023-11-29 ENCOUNTER — Other Ambulatory Visit (HOSPITAL_BASED_OUTPATIENT_CLINIC_OR_DEPARTMENT_OTHER): Payer: Self-pay

## 2023-11-30 ENCOUNTER — Other Ambulatory Visit (HOSPITAL_BASED_OUTPATIENT_CLINIC_OR_DEPARTMENT_OTHER): Payer: Self-pay

## 2023-11-30 MED ORDER — OXYCODONE HCL 10 MG PO TABS
10.0000 mg | ORAL_TABLET | Freq: Three times a day (TID) | ORAL | 0 refills | Status: DC | PRN
  Filled 2023-11-30: qty 90, 30d supply, fill #0

## 2023-12-05 ENCOUNTER — Ambulatory Visit
Admission: RE | Admit: 2023-12-05 | Discharge: 2023-12-05 | Disposition: A | Discharge status: Home / Self Care | Payer: Commercial Managed Care - PPO | Source: Ambulatory Visit | Attending: Surgery

## 2023-12-05 DIAGNOSIS — K769 Liver disease, unspecified: Secondary | ICD-10-CM | POA: Diagnosis not present

## 2023-12-05 DIAGNOSIS — R1011 Right upper quadrant pain: Secondary | ICD-10-CM

## 2023-12-05 DIAGNOSIS — K8020 Calculus of gallbladder without cholecystitis without obstruction: Secondary | ICD-10-CM | POA: Diagnosis not present

## 2023-12-12 ENCOUNTER — Ambulatory Visit (INDEPENDENT_AMBULATORY_CARE_PROVIDER_SITE_OTHER): Payer: Commercial Managed Care - PPO | Admitting: Family Medicine

## 2023-12-12 ENCOUNTER — Encounter: Payer: Self-pay | Admitting: Family Medicine

## 2023-12-12 ENCOUNTER — Other Ambulatory Visit (HOSPITAL_BASED_OUTPATIENT_CLINIC_OR_DEPARTMENT_OTHER): Payer: Self-pay

## 2023-12-12 VITALS — BP 124/78 | HR 77 | Temp 97.8°F | Ht 67.0 in | Wt 237.8 lb

## 2023-12-12 DIAGNOSIS — K760 Fatty (change of) liver, not elsewhere classified: Secondary | ICD-10-CM | POA: Diagnosis not present

## 2023-12-12 DIAGNOSIS — E559 Vitamin D deficiency, unspecified: Secondary | ICD-10-CM | POA: Diagnosis not present

## 2023-12-12 DIAGNOSIS — Z0000 Encounter for general adult medical examination without abnormal findings: Secondary | ICD-10-CM | POA: Diagnosis not present

## 2023-12-12 DIAGNOSIS — K8020 Calculus of gallbladder without cholecystitis without obstruction: Secondary | ICD-10-CM | POA: Diagnosis not present

## 2023-12-12 DIAGNOSIS — M48061 Spinal stenosis, lumbar region without neurogenic claudication: Secondary | ICD-10-CM | POA: Diagnosis not present

## 2023-12-12 DIAGNOSIS — Z1159 Encounter for screening for other viral diseases: Secondary | ICD-10-CM

## 2023-12-12 DIAGNOSIS — I2510 Atherosclerotic heart disease of native coronary artery without angina pectoris: Secondary | ICD-10-CM | POA: Diagnosis not present

## 2023-12-12 DIAGNOSIS — G8929 Other chronic pain: Secondary | ICD-10-CM

## 2023-12-12 DIAGNOSIS — G894 Chronic pain syndrome: Secondary | ICD-10-CM | POA: Diagnosis not present

## 2023-12-12 DIAGNOSIS — M5441 Lumbago with sciatica, right side: Secondary | ICD-10-CM

## 2023-12-12 DIAGNOSIS — I10 Essential (primary) hypertension: Secondary | ICD-10-CM

## 2023-12-12 DIAGNOSIS — R7303 Prediabetes: Secondary | ICD-10-CM

## 2023-12-12 DIAGNOSIS — M5442 Lumbago with sciatica, left side: Secondary | ICD-10-CM | POA: Diagnosis not present

## 2023-12-12 DIAGNOSIS — E66812 Obesity, class 2: Secondary | ICD-10-CM

## 2023-12-12 DIAGNOSIS — M170 Bilateral primary osteoarthritis of knee: Secondary | ICD-10-CM | POA: Diagnosis not present

## 2023-12-12 DIAGNOSIS — G43009 Migraine without aura, not intractable, without status migrainosus: Secondary | ICD-10-CM

## 2023-12-12 DIAGNOSIS — I2584 Coronary atherosclerosis due to calcified coronary lesion: Secondary | ICD-10-CM | POA: Diagnosis not present

## 2023-12-12 DIAGNOSIS — M47816 Spondylosis without myelopathy or radiculopathy, lumbar region: Secondary | ICD-10-CM | POA: Diagnosis not present

## 2023-12-12 LAB — URINALYSIS, ROUTINE W REFLEX MICROSCOPIC
Bilirubin Urine: NEGATIVE
Hgb urine dipstick: NEGATIVE
Ketones, ur: NEGATIVE
Leukocytes,Ua: NEGATIVE
Nitrite: NEGATIVE
Specific Gravity, Urine: 1.02 (ref 1.000–1.030)
Total Protein, Urine: NEGATIVE
Urine Glucose: NEGATIVE
Urobilinogen, UA: 0.2 (ref 0.0–1.0)
pH: 6 (ref 5.0–8.0)

## 2023-12-12 LAB — CBC WITH DIFFERENTIAL/PLATELET
Basophils Absolute: 0 10*3/uL (ref 0.0–0.1)
Basophils Relative: 1 % (ref 0.0–3.0)
Eosinophils Absolute: 0.1 10*3/uL (ref 0.0–0.7)
Eosinophils Relative: 2.2 % (ref 0.0–5.0)
HCT: 40.1 % (ref 36.0–46.0)
Hemoglobin: 13.1 g/dL (ref 12.0–15.0)
Lymphocytes Relative: 62.7 % — ABNORMAL HIGH (ref 12.0–46.0)
Lymphs Abs: 2.4 10*3/uL (ref 0.7–4.0)
MCHC: 32.7 g/dL (ref 30.0–36.0)
MCV: 86.7 fL (ref 78.0–100.0)
Monocytes Absolute: 0.2 10*3/uL (ref 0.1–1.0)
Monocytes Relative: 6.1 % (ref 3.0–12.0)
Neutro Abs: 1.1 10*3/uL — ABNORMAL LOW (ref 1.4–7.7)
Neutrophils Relative %: 28 % — ABNORMAL LOW (ref 43.0–77.0)
Platelets: 206 10*3/uL (ref 150.0–400.0)
RBC: 4.63 Mil/uL (ref 3.87–5.11)
RDW: 13 % (ref 11.5–15.5)
WBC: 3.9 10*3/uL — ABNORMAL LOW (ref 4.0–10.5)

## 2023-12-12 LAB — MICROALBUMIN / CREATININE URINE RATIO
Creatinine,U: 149.7 mg/dL
Microalb Creat Ratio: 5.1 mg/g (ref 0.0–30.0)
Microalb, Ur: 0.8 mg/dL (ref 0.0–1.9)

## 2023-12-12 LAB — TSH: TSH: 0.84 u[IU]/mL (ref 0.35–5.50)

## 2023-12-12 LAB — HEMOGLOBIN A1C: Hgb A1c MFr Bld: 6.5 % (ref 4.6–6.5)

## 2023-12-12 MED ORDER — PREGABALIN 50 MG PO CAPS
50.0000 mg | ORAL_CAPSULE | Freq: Two times a day (BID) | ORAL | 3 refills | Status: DC | PRN
  Filled 2023-12-12 – 2024-03-30 (×2): qty 60, 30d supply, fill #0
  Filled 2024-05-24: qty 60, 30d supply, fill #1

## 2023-12-12 MED ORDER — OXYCODONE HCL 10 MG PO TABS
10.0000 mg | ORAL_TABLET | Freq: Three times a day (TID) | ORAL | 0 refills | Status: DC | PRN
  Filled 2023-12-12 – 2023-12-28 (×2): qty 90, 30d supply, fill #0

## 2023-12-12 MED ORDER — OXYCODONE HCL 10 MG PO TABS
10.0000 mg | ORAL_TABLET | Freq: Three times a day (TID) | ORAL | 0 refills | Status: DC | PRN
Start: 2024-01-09 — End: 2024-02-14
  Filled 2024-01-27: qty 90, 30d supply, fill #0

## 2023-12-12 NOTE — Progress Notes (Unsigned)
 Assessment  Assessment/Plan:  Gallstones Right upper quadrant abdominal discomfort with confirmed gallstones on ultrasound. Symptoms may be exacerbated by Zepbound. Patient prefers to avoid surgery unless necessary. - Follow up with general surgery for further management.  Metabolic Dysfunction Associated Steatotic Liver Disease Ultrasound revealed hepatic steatosis. Liver function tests are normal.  Associated with obesity - Monitor liver function through imaging and lab work as needed. -Recommend weight loss with lifestyle changes including lower calorie diet and exercise  Chronic Pain Lower back pain managed with oxycodone, Brex, and Lyrica. Pain is well-managed with current regimen. - Continue current pain management regimen.  Migraines Managed with Trokendi XR. No recent migraines. Occasionally uses Zofran for nausea. Prefers not to use rescue medications like Imitrex. - Continue Trokendi XR as prescribed.  Hypertension Blood pressure well-controlled at 124/78 mmHg on losartan 100 mg and chlorthalidone 12.5 mg. No reported side effects. - Continue losartan 100 mg daily. - Continue chlorthalidone 12.5 mg daily. - Recheck blood pressure in 6 months.  Hyperlipidemia Cholesterol levels well-controlled on rosuvastatin 5 mg. Initially reported muscle pain, now taking medication on Monday, Wednesday, and Friday as per cardiologist's advice. - Continue rosuvastatin 5 mg on Monday, Wednesday, and Friday.  Prediabetes Fasting glucose 117 mg/dL, indicating prediabetes. A1c test recommended to confirm diagnosis and monitor blood sugar levels over 3-6 months. - Order A1c test.  General Health Maintenance Up to date on colonoscopy and mammogram. Routine screenings for HIV and hepatitis C recommended. Vitamin D levels not checked recently. Likely up to date on tetanus vaccination but will verify records. - Request records for colonoscopy and mammogram. - Add HIV and hepatitis C  screening to lab work. - Check vitamin D levels. - Verify tetanus vaccination status.        Medications Discontinued During This Encounter  Medication Reason   pregabalin (LYRICA) 50 MG capsule    Oxycodone HCl 10 MG TABS     Patient Counseling(The following topics were reviewed and/or handout was given):  -Nutrition: Stressed importance of moderation in sodium/caffeine intake, saturated fat and cholesterol, caloric balance, sufficient intake of fresh fruits, vegetables, and fiber.  -Stressed the importance of regular exercise.   -Substance Abuse: Discussed cessation/primary prevention of tobacco, alcohol, or other drug use; driving or other dangerous activities under the influence; availability of treatment for abuse.   -Injury prevention: Discussed safety belts, safety helmets, smoke detector, smoking near bedding or upholstery.   -Sexuality: Discussed sexually transmitted diseases, partner selection, use of condoms, avoidance of unintended pregnancy and contraceptive alternatives.   -Dental health: Discussed importance of regular tooth brushing, flossing, and dental visits.  -Health maintenance and immunizations reviewed. Please refer to Health maintenance section.  Return in about 6 months (around 06/10/2024) for BP.        Subjective:  Chief complaint Encounter date: 12/12/2023  Chief Complaint  Patient presents with   Annual Exam    Fasting. Requesting records from [facility] (colonoscopy) and [facility] (mammogram)   Jasmin Swanson is a 53 y.o. female who presents today for her annual comprehensive physical exam.    Discussed the use of AI scribe software for clinical note transcription with the patient, who gave verbal consent to proceed.  History of Present Illness   Jasmin Swanson is a 53 year old female who presents for an annual physical exam.  She is fasting for lab work today, including an A1c and thyroid levels. She has a history of vitamin D deficiency and has been  taking multivitamins and weekly  vitamin D supplements.  She experiences ongoing abdominal discomfort, previously evaluated with an ultrasound revealing gallstones. She has not yet received feedback from the surgery department regarding the ultrasound results. She experiences occasional dull, pinching pain in the right upper abdomen, similar to previous episodes that led to emergency room visits. No current chest pain, shortness of breath, or leg swelling.  Her hypertension is managed with losartan 100 mg and chlorthalidone 12.5 mg, and her blood pressure is reported as stable. No side effects such as muscle aches or trouble urinating.  She is on rosuvastatin 5 mg for coronary artery disease, taken three times a week due to previous muscle pain. Her cholesterol levels are well-controlled. She also takes oxycodone, Brex, and Lyrica for lower back pain, which is stable.  She has a history of migraines, currently well-managed with Trokendi XR (extended-release topiramate). No recent migraines, but she used Zofran for nausea two weeks ago. She no longer uses rescue medications like Imitrex but occasionally uses muscle relaxers like tizanidine for neck tension.  She has a history of hepatic steatosis noted on ultrasound, but her liver function tests are normal.  Her fasting glucose was 117 mg/dL, indicating prediabetes.  She has had a colonoscopy and mammogram, and routine screenings for HIV and hepatitis C are recommended. She is unsure about her tetanus immunization status. Vitamin D levels have not been checked recently, but she takes a multivitamin.          12/12/2023    1:22 PM 11/07/2023   10:16 AM  Depression screen PHQ 2/9  Decreased Interest 0 0  Down, Depressed, Hopeless 0 0  PHQ - 2 Score 0 0  Altered sleeping 0 0  Tired, decreased energy 1 1  Change in appetite 0 0  Feeling bad or failure about yourself  0 0  Trouble concentrating 2 1  Moving slowly or fidgety/restless 0 0   Suicidal thoughts 0 0  PHQ-9 Score 3 2  Difficult doing work/chores Not difficult at all Not difficult at all       12/12/2023    1:22 PM 11/07/2023   10:16 AM  GAD 7 : Generalized Anxiety Score  Nervous, Anxious, on Edge 0 0  Control/stop worrying 0 0  Worry too much - different things 0 0  Trouble relaxing 0 0  Restless 0 0  Easily annoyed or irritable 0 0  Afraid - awful might happen 0 0  Total GAD 7 Score 0 0  Anxiety Difficulty Not difficult at all Not difficult at all    Health Maintenance Due  Topic Date Due   HIV Screening  Never done   Hepatitis C Screening  Never done       PMH:  The following were reviewed and entered/updated in epic: Past Medical History:  Diagnosis Date   Coronary artery calcification    Hypertension    Migraine    Obesity (BMI 30-39.9)     Patient Active Problem List   Diagnosis Date Noted   Metabolic dysfunction-associated steatotic liver disease (MASLD) 12/12/2023   Vitamin D deficiency 12/12/2023   Primary hypertension 11/07/2023   RUQ pain 11/07/2023   Gall stones 11/07/2023   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/07/2023   Coronary artery disease due to calcified coronary lesion 11/07/2023   Chronic low back pain 11/07/2023   Migraine, unspecified, not intractable, without status migrainosus 11/16/2021    Past Surgical History:  Procedure Laterality Date   CESAREAN SECTION  2003   CESAREAN SECTION  2006  Family History  Problem Relation Age of Onset   Hypertension Mother    Diabetes Mother    Diabetes Father    Hypertension Father    Hypertension Sister    Hypertension Brother     Medications- reviewed and updated Outpatient Medications Prior to Visit  Medication Sig Dispense Refill   celecoxib (CELEBREX) 100 MG capsule Take 1 capsule (100 mg total) by mouth daily as needed. 30 capsule 0   chlorthalidone (HYGROTON) 25 MG tablet Take 0.5 tablets (12.5 mg total) by mouth daily. Pt reports half a tablet 90  tablet 3   losartan (COZAAR) 100 MG tablet Take 1 tablet (100 mg total) by mouth daily at 10 pm. 90 tablet 3   ondansetron (ZOFRAN-ODT) 4 MG disintegrating tablet Take 1 tablet (4 mg total) by mouth every 8 (eight) hours as needed for nausea or vomiting. 20 tablet 0   Topiramate ER (TROKENDI XR) 200 MG CP24 Take 1 capsule (200 mg total) by mouth daily at 12 noon. 90 capsule 3   Oxycodone HCl 10 MG TABS Take 10 mg by mouth as needed.     pregabalin (LYRICA) 50 MG capsule Take 1 capsule (50 mg total) by mouth 2 (two) times daily as needed. 60 capsule 2   Oxycodone HCl 10 MG TABS Take 1 tablet (10 mg total) by mouth 3 (three) times daily as needed. (Patient not taking: Reported on 12/12/2023) 90 tablet 0   [START ON 01/09/2024] Oxycodone HCl 10 MG TABS Take 1 tablet (10 mg total) by mouth 3 (three) times daily as needed. (Patient not taking: Reported on 12/12/2023) 90 tablet 0   pregabalin (LYRICA) 50 MG capsule Take 1 capsule (50 mg total) by mouth 2 (two) times daily as needed. (Patient not taking: Reported on 12/12/2023) 60 capsule 3   rosuvastatin (CRESTOR) 5 MG tablet Take 1 tablet (5 mg total) by mouth daily. (Patient taking differently: Take 5 mg by mouth daily. Patient reports taking MWF) 90 tablet 3   No facility-administered medications prior to visit.    Allergies  Allergen Reactions   Oxycodone-Acetaminophen Nausea Only    Dizziness and nausea.     Social History   Socioeconomic History   Marital status: Married    Spouse name: Not on file   Number of children: Not on file   Years of education: Not on file   Highest education level: Not on file  Occupational History   Not on file  Tobacco Use   Smoking status: Never    Passive exposure: Never   Smokeless tobacco: Never  Vaping Use   Vaping status: Never Used  Substance and Sexual Activity   Alcohol use: Never   Drug use: Never   Sexual activity: Not on file  Other Topics Concern   Not on file  Social History Narrative    Not on file   Social Drivers of Health   Financial Resource Strain: Not on file  Food Insecurity: Not on file  Transportation Needs: Not on file  Physical Activity: Not on file  Stress: Not on file  Social Connections: Not on file           Objective:  Physical Exam: BP 124/78   Pulse 77   Temp 97.8 F (36.6 C) (Temporal)   Ht 5\' 7"  (1.702 m)   Wt 237 lb 12.8 oz (107.9 kg)   LMP 09/25/2023   SpO2 99%   BMI 37.24 kg/m   Body mass index is 37.24 kg/m. Wt  Readings from Last 3 Encounters:  12/12/23 237 lb 12.8 oz (107.9 kg)  11/22/23 240 lb 3.2 oz (109 kg)  11/07/23 238 lb (108 kg)    Physical Exam Constitutional:      General: She is not in acute distress.    Appearance: Normal appearance. She is not ill-appearing or toxic-appearing.  HENT:     Head: Normocephalic and atraumatic.     Right Ear: Hearing, tympanic membrane, ear canal and external ear normal. There is no impacted cerumen.     Left Ear: Hearing, tympanic membrane, ear canal and external ear normal. There is no impacted cerumen.     Nose: Nose normal. No congestion.     Mouth/Throat:     Lips: No lesions.     Mouth: Mucous membranes are moist.     Pharynx: Oropharynx is clear. No oropharyngeal exudate.  Eyes:     General: No scleral icterus.       Right eye: No discharge.        Left eye: No discharge.     Conjunctiva/sclera: Conjunctivae normal.     Pupils: Pupils are equal, round, and reactive to light.  Neck:     Thyroid: No thyroid mass, thyromegaly or thyroid tenderness.  Cardiovascular:     Rate and Rhythm: Normal rate and regular rhythm.     Pulses: Normal pulses.     Heart sounds: Normal heart sounds.  Pulmonary:     Effort: Pulmonary effort is normal. No respiratory distress.     Breath sounds: Normal breath sounds.  Abdominal:     General: Abdomen is flat. Bowel sounds are normal.     Palpations: Abdomen is soft.  Musculoskeletal:        General: Normal range of motion.      Cervical back: Normal range of motion.     Right lower leg: No edema.     Left lower leg: No edema.  Lymphadenopathy:     Cervical: No cervical adenopathy.  Skin:    General: Skin is warm and dry.     Findings: No rash.  Neurological:     General: No focal deficit present.     Mental Status: She is alert and oriented to person, place, and time. Mental status is at baseline.     Deep Tendon Reflexes:     Reflex Scores:      Patellar reflexes are 2+ on the right side and 2+ on the left side. Psychiatric:        Mood and Affect: Mood normal.        Behavior: Behavior normal.        Thought Content: Thought content normal.        Judgment: Judgment normal.         At today's visit, we discussed treatment options, associated risk and benefits, and engage in counseling as needed.  Additionally the following were reviewed: Past medical records, past medical and surgical history, family and social background, as well as relevant laboratory results, imaging findings, and specialty notes, where applicable.  This message was generated using dictation software, and as a result, it may contain unintentional typos or errors.  Nevertheless, extensive effort was made to accurately convey at the pertinent aspects of the patient visit.    There may have been are other unrelated non-urgent complaints, but due to the busy schedule and the amount of time already spent with her, time does not permit to address these issues at today's visit. Another appointment  may have or has been requested to review these additional issues.     Thomes Dinning, MD, MS

## 2023-12-12 NOTE — Patient Instructions (Signed)
 VISIT SUMMARY:  Today, you had your annual physical exam. We discussed your ongoing health issues, including gallstones, hepatic steatosis, chronic pain, migraines, hypertension, hyperlipidemia, and prediabetes. We also reviewed your general health maintenance and recommended some routine screenings and lab tests.  YOUR PLAN:  -GALLSTONES: Gallstones are hardened deposits in the gallbladder that can cause abdominal pain. You have confirmed gallstones and experience occasional discomfort. Please follow up with general surgery for further management.  -HEPATIC STEATOSIS: Hepatic steatosis, also known as fatty liver, is the buildup of fat in the liver. Your liver function tests are normal, but we will continue to monitor your liver function through imaging and lab work as needed.  -CHRONIC PAIN: Your lower back pain is being managed with oxycodone, Brex, and Lyrica. Your current pain management regimen is effective, so please continue with it.  -MIGRAINES: Your migraines are well-managed with Trokendi XR, and you have not had any recent episodes. Continue taking Trokendi XR as prescribed.  -HYPERTENSION: Hypertension, or high blood pressure, is well-controlled with your current medications, losartan and chlorthalidone. Continue taking losartan 100 mg daily and chlorthalidone 12.5 mg daily. We will recheck your blood pressure in 6 months.  -HYPERLIPIDEMIA: Hyperlipidemia is high cholesterol levels in the blood. Your cholesterol is well-controlled with rosuvastatin. Continue taking rosuvastatin 5 mg on Monday, Wednesday, and Friday.  -PREDIABETES: Prediabetes is when your blood sugar levels are higher than normal but not high enough to be classified as diabetes. Your fasting glucose was 117 mg/dL. We will order an A1c test to confirm the diagnosis and monitor your blood sugar levels over the next 3-6 months.  -GENERAL HEALTH MAINTENANCE: You are up to date on your colonoscopy and mammogram. We  recommend routine screenings for HIV and hepatitis C, checking your vitamin D levels, and verifying your tetanus vaccination status.  INSTRUCTIONS:  Please follow up with primary care in 6 months and with general surgery for gallstones. We will also order an A1c test to monitor your blood sugar levels. Additionally, we will add HIV and hepatitis C screening to your lab work, check your vitamin D levels, and verify your tetanus vaccination status.

## 2023-12-13 DIAGNOSIS — R7303 Prediabetes: Secondary | ICD-10-CM | POA: Insufficient documentation

## 2023-12-17 LAB — VITAMIN D 1,25 DIHYDROXY
Vitamin D 1, 25 (OH)2 Total: 53 pg/mL (ref 18–72)
Vitamin D2 1, 25 (OH)2: <8 pg/mL
Vitamin D3 1, 25 (OH)2: 53 pg/mL

## 2023-12-17 LAB — HIV ANTIBODY (ROUTINE TESTING W REFLEX): HIV 1&2 Ab, 4th Generation: NONREACTIVE

## 2023-12-17 LAB — HEPATITIS C ANTIBODY: Hepatitis C Ab: NONREACTIVE

## 2023-12-19 ENCOUNTER — Other Ambulatory Visit: Payer: Self-pay | Admitting: Family Medicine

## 2023-12-19 ENCOUNTER — Encounter: Payer: Self-pay | Admitting: Family Medicine

## 2023-12-19 DIAGNOSIS — D708 Other neutropenia: Secondary | ICD-10-CM | POA: Insufficient documentation

## 2023-12-26 ENCOUNTER — Other Ambulatory Visit (HOSPITAL_BASED_OUTPATIENT_CLINIC_OR_DEPARTMENT_OTHER): Payer: Self-pay

## 2023-12-26 DIAGNOSIS — Z1231 Encounter for screening mammogram for malignant neoplasm of breast: Secondary | ICD-10-CM | POA: Diagnosis not present

## 2023-12-26 DIAGNOSIS — N950 Postmenopausal bleeding: Secondary | ICD-10-CM | POA: Diagnosis not present

## 2023-12-26 LAB — HM MAMMOGRAPHY

## 2023-12-27 ENCOUNTER — Other Ambulatory Visit (HOSPITAL_BASED_OUTPATIENT_CLINIC_OR_DEPARTMENT_OTHER): Payer: Self-pay

## 2023-12-28 ENCOUNTER — Other Ambulatory Visit (HOSPITAL_BASED_OUTPATIENT_CLINIC_OR_DEPARTMENT_OTHER): Payer: Self-pay

## 2024-01-23 DIAGNOSIS — M7542 Impingement syndrome of left shoulder: Secondary | ICD-10-CM | POA: Diagnosis not present

## 2024-01-23 DIAGNOSIS — M7541 Impingement syndrome of right shoulder: Secondary | ICD-10-CM | POA: Diagnosis not present

## 2024-01-27 ENCOUNTER — Other Ambulatory Visit (HOSPITAL_BASED_OUTPATIENT_CLINIC_OR_DEPARTMENT_OTHER): Payer: Self-pay

## 2024-02-14 ENCOUNTER — Other Ambulatory Visit (HOSPITAL_BASED_OUTPATIENT_CLINIC_OR_DEPARTMENT_OTHER): Payer: Self-pay

## 2024-02-14 DIAGNOSIS — G894 Chronic pain syndrome: Secondary | ICD-10-CM | POA: Diagnosis not present

## 2024-02-14 DIAGNOSIS — M170 Bilateral primary osteoarthritis of knee: Secondary | ICD-10-CM | POA: Diagnosis not present

## 2024-02-14 DIAGNOSIS — M48061 Spinal stenosis, lumbar region without neurogenic claudication: Secondary | ICD-10-CM | POA: Diagnosis not present

## 2024-02-14 DIAGNOSIS — Z79891 Long term (current) use of opiate analgesic: Secondary | ICD-10-CM | POA: Diagnosis not present

## 2024-02-14 DIAGNOSIS — M47816 Spondylosis without myelopathy or radiculopathy, lumbar region: Secondary | ICD-10-CM | POA: Diagnosis not present

## 2024-02-14 MED ORDER — CELECOXIB 100 MG PO CAPS
100.0000 mg | ORAL_CAPSULE | Freq: Every day | ORAL | 0 refills | Status: AC | PRN
  Filled 2024-02-14 – 2024-02-28 (×2): qty 30, 30d supply, fill #0

## 2024-02-14 MED ORDER — OXYCODONE HCL 10 MG PO TABS
10.0000 mg | ORAL_TABLET | Freq: Three times a day (TID) | ORAL | 0 refills | Status: DC | PRN
  Filled 2024-02-28: qty 90, 30d supply, fill #0

## 2024-02-14 MED ORDER — OXYCODONE HCL 10 MG PO TABS
10.0000 mg | ORAL_TABLET | Freq: Three times a day (TID) | ORAL | 0 refills | Status: DC | PRN
  Filled 2024-03-30: qty 90, 30d supply, fill #0

## 2024-02-24 ENCOUNTER — Other Ambulatory Visit (HOSPITAL_BASED_OUTPATIENT_CLINIC_OR_DEPARTMENT_OTHER): Payer: Self-pay

## 2024-02-28 ENCOUNTER — Other Ambulatory Visit (HOSPITAL_BASED_OUTPATIENT_CLINIC_OR_DEPARTMENT_OTHER): Payer: Self-pay

## 2024-02-29 ENCOUNTER — Other Ambulatory Visit (HOSPITAL_BASED_OUTPATIENT_CLINIC_OR_DEPARTMENT_OTHER): Payer: Self-pay

## 2024-03-15 ENCOUNTER — Encounter: Payer: Self-pay | Admitting: Nurse Practitioner

## 2024-03-15 ENCOUNTER — Ambulatory Visit (INDEPENDENT_AMBULATORY_CARE_PROVIDER_SITE_OTHER): Payer: Self-pay | Admitting: Nurse Practitioner

## 2024-03-15 VITALS — BP 130/74 | HR 68 | Ht 67.0 in | Wt 237.0 lb

## 2024-03-15 DIAGNOSIS — R110 Nausea: Secondary | ICD-10-CM

## 2024-03-15 DIAGNOSIS — G43009 Migraine without aura, not intractable, without status migrainosus: Secondary | ICD-10-CM

## 2024-03-15 MED ORDER — TRIAMCINOLONE ACETONIDE 40 MG/ML IJ SUSP
60.0000 mg | Freq: Once | INTRAMUSCULAR | Status: AC
  Administered 2024-03-15: 60 mg via INTRAMUSCULAR

## 2024-03-15 MED ORDER — KETOROLAC TROMETHAMINE 60 MG/2ML IM SOLN
60.0000 mg | Freq: Once | INTRAMUSCULAR | Status: AC
  Administered 2024-03-15: 60 mg via INTRAMUSCULAR

## 2024-03-15 NOTE — Progress Notes (Unsigned)
 [place], CMA,acting as a Neurosurgeon for Jasmin Epley, FNP.,have documented all relevant documentation on the behalf of Jasmin Epley, FNP,as directed by  Jasmin Epley, FNP while in the presence of Jasmin Epley, FNP.  Subjective:  Patient ID: Jasmin Swanson , female    DOB: 01-09-1971 , 53 y.o.   MRN: [id number]  No chief complaint on file.   HPI  Here for migraine. She has a history of migraines for 20 years. She is on topamax  200 mg total. When she forgets she would have   Trokendi  XR  Recent times she realizes if she misses a few doses she will start to have headaches. She has been having them more thought to be related to menopause. This migraine has been for past few days. She has not taken the trokendi  the past 2 days. Woke up about 3-4 this morning with pain "poking" to the left side of her head. She does have zofran  at home but makes her sleepy so she did not take any today due to working  Migraine  The problem occurs constantly. The pain is located in the Left unilateral region. Associated symptoms include nausea, phonophobia and photophobia. Pertinent negatives include no blurred vision, coughing, dizziness or fever. Associated symptoms comments: Paulene Boron hurts her eyes. The symptoms are aggravated by bright light. She has tried darkened room and acetaminophen (nurtec) for the symptoms. Her past medical history is significant for obesity. There is no history of cancer or hypertension.     Past Medical History:  Diagnosis Date   Coronary artery calcification    Hypertension    Migraine    Obesity (BMI 30-39.9)      Family History  Problem Relation Age of Onset   Hypertension Mother    Diabetes Mother    Diabetes Father    Hypertension Father    Hypertension Sister    Hypertension Brother      Current Outpatient Medications:    celecoxib  (CELEBREX ) 100 MG capsule, Take 1 capsule (100 mg total) by mouth daily as needed., Disp: 30 capsule, Rfl: 0   celecoxib  (CELEBREX ) 100 MG  capsule, Take 1 capsule (100 mg total) by mouth daily as needed., Disp: 30 capsule, Rfl: 0   chlorthalidone  (HYGROTON ) 25 MG tablet, Take 0.5 tablets (12.5 mg total) by mouth daily. Pt reports half a tablet, Disp: 90 tablet, Rfl: 3   losartan  (COZAAR ) 100 MG tablet, Take 1 tablet (100 mg total) by mouth daily at 10 pm., Disp: 90 tablet, Rfl: 3   ondansetron  (ZOFRAN -ODT) 4 MG disintegrating tablet, Take 1 tablet (4 mg total) by mouth every 8 (eight) hours as needed for nausea or vomiting., Disp: 20 tablet, Rfl: 0   Oxycodone  HCl 10 MG TABS, Take 1 tablet by mouth three times a day as needed, Disp: 90 tablet, Rfl: 0   Oxycodone  HCl 10 MG TABS, Take 1 tablet by mouth three times a day as needed, Disp: 90 tablet, Rfl: 0   Topiramate  ER (TROKENDI  XR) 200 MG CP24, Take 1 capsule (200 mg total) by mouth daily at 12 noon., Disp: 90 capsule, Rfl: 3   Oxycodone  HCl 10 MG TABS, Take 1 tablet (10 mg total) by mouth 3 (three) times daily as needed. (Patient not taking: Reported on 03/15/2024), Disp: 90 tablet, Rfl: 0   pregabalin  (LYRICA ) 50 MG capsule, Take 1 capsule (50 mg total) by mouth 2 (two) times daily as needed. (Patient not taking: Reported on 03/15/2024), Disp: 60 capsule, Rfl: 3   rosuvastatin  (  CRESTOR ) 5 MG tablet, Take 1 tablet (5 mg total) by mouth daily. (Patient taking differently: Take 5 mg by mouth daily. Patient reports taking MWF), Disp: 90 tablet, Rfl: 3  Current Facility-Administered Medications:    ketorolac (TORADOL) injection 60 mg, 60 mg, Intramuscular, Once,    triamcinolone acetonide (KENALOG-40) injection 60 mg, 60 mg, Intramuscular, Once,    Allergies  Allergen Reactions   Oxycodone -Acetaminophen Nausea Only    Dizziness and nausea.      Review of Systems  Constitutional:  Negative for fever.  Eyes:  Positive for photophobia. Negative for blurred vision.  Respiratory:  Negative for cough.   Gastrointestinal:  Positive for nausea.  Neurological:  Negative for dizziness.      Today's Vitals   03/15/24 1109  BP: 130/74  Pulse: 68  Weight: 237 lb (107.5 kg)  Height: 5\' 7"  (1.702 m)   Body mass index is 37.12 kg/m.  Wt Readings from Last 3 Encounters:  03/15/24 237 lb (107.5 kg)  12/12/23 237 lb 12.8 oz (107.9 kg)  11/22/23 240 lb 3.2 oz (109 kg)    Objective:  Physical Exam Vitals reviewed.  Cardiovascular:     Rate and Rhythm: Normal rate and regular rhythm.     Pulses: Normal pulses.     Heart sounds: Normal heart sounds. No murmur heard. Pulmonary:     Effort: Pulmonary effort is normal. No respiratory distress.     Breath sounds: Normal breath sounds. No wheezing.         Assessment And Plan:  Migraine without aura and without status migrainosus, not intractable -     Triamcinolone Acetonide -     Ketorolac Tromethamine  Nausea    No follow-ups on file.  Patient was given opportunity to ask questions. Patient verbalized understanding of the plan and was able to repeat key elements of the plan. All questions were answered to their satisfaction.    Inge Mangle, FNP, have reviewed all documentation for this visit. The documentation on 03/15/24 for the exam, diagnosis, procedures, and orders are all accurate and complete.   IF YOU HAVE BEEN REFERRED TO A SPECIALIST, IT MAY TAKE 1-2 WEEKS TO SCHEDULE/PROCESS THE REFERRAL. IF YOU HAVE NOT HEARD FROM US /SPECIALIST IN TWO WEEKS, PLEASE GIVE US  A CALL AT [phone].

## 2024-03-22 MED ORDER — QULIPTA 60 MG PO TABS: 1.0000 | ORAL_TABLET | Freq: Every day | ORAL | 1 refills | Status: DC

## 2024-03-26 ENCOUNTER — Ambulatory Visit (HOSPITAL_BASED_OUTPATIENT_CLINIC_OR_DEPARTMENT_OTHER)
Admission: RE | Admit: 2024-03-26 | Discharge: 2024-03-26 | Disposition: A | Discharge status: Home / Self Care | Payer: Commercial Managed Care - PPO | Source: Ambulatory Visit | Attending: Cardiology | Admitting: Cardiology

## 2024-03-26 DIAGNOSIS — I77810 Thoracic aortic ectasia: Secondary | ICD-10-CM | POA: Diagnosis not present

## 2024-03-26 DIAGNOSIS — I77819 Aortic ectasia, unspecified site: Secondary | ICD-10-CM | POA: Diagnosis not present

## 2024-03-30 ENCOUNTER — Other Ambulatory Visit: Payer: Self-pay

## 2024-03-30 ENCOUNTER — Other Ambulatory Visit (HOSPITAL_BASED_OUTPATIENT_CLINIC_OR_DEPARTMENT_OTHER): Payer: Self-pay

## 2024-04-04 ENCOUNTER — Ambulatory Visit: Payer: Self-pay | Admitting: Cardiology

## 2024-04-10 ENCOUNTER — Other Ambulatory Visit (HOSPITAL_BASED_OUTPATIENT_CLINIC_OR_DEPARTMENT_OTHER): Payer: Self-pay

## 2024-04-10 DIAGNOSIS — G894 Chronic pain syndrome: Secondary | ICD-10-CM | POA: Diagnosis not present

## 2024-04-10 DIAGNOSIS — M47816 Spondylosis without myelopathy or radiculopathy, lumbar region: Secondary | ICD-10-CM | POA: Diagnosis not present

## 2024-04-10 DIAGNOSIS — M48061 Spinal stenosis, lumbar region without neurogenic claudication: Secondary | ICD-10-CM | POA: Diagnosis not present

## 2024-04-10 DIAGNOSIS — M170 Bilateral primary osteoarthritis of knee: Secondary | ICD-10-CM | POA: Diagnosis not present

## 2024-04-10 MED ORDER — PREGABALIN 50 MG PO CAPS
50.0000 mg | ORAL_CAPSULE | Freq: Two times a day (BID) | ORAL | 3 refills | Status: DC | PRN
  Filled 2024-06-26: qty 60, 30d supply, fill #0

## 2024-04-10 MED ORDER — OXYCODONE HCL 10 MG PO TABS
10.0000 mg | ORAL_TABLET | Freq: Three times a day (TID) | ORAL | 0 refills | Status: DC | PRN
  Filled 2024-06-04: qty 90, 30d supply, fill #0

## 2024-04-10 MED ORDER — OXYCODONE HCL 10 MG PO TABS
10.0000 mg | ORAL_TABLET | Freq: Three times a day (TID) | ORAL | 0 refills | Status: DC | PRN
  Filled 2024-05-03: qty 90, 30d supply, fill #0

## 2024-04-20 ENCOUNTER — Encounter: Payer: Self-pay | Admitting: Family Medicine

## 2024-05-03 ENCOUNTER — Other Ambulatory Visit (HOSPITAL_BASED_OUTPATIENT_CLINIC_OR_DEPARTMENT_OTHER): Payer: Self-pay

## 2024-05-24 ENCOUNTER — Other Ambulatory Visit: Payer: Self-pay

## 2024-05-24 ENCOUNTER — Other Ambulatory Visit (HOSPITAL_BASED_OUTPATIENT_CLINIC_OR_DEPARTMENT_OTHER): Payer: Self-pay

## 2024-05-25 ENCOUNTER — Other Ambulatory Visit (HOSPITAL_BASED_OUTPATIENT_CLINIC_OR_DEPARTMENT_OTHER): Payer: Self-pay

## 2024-06-04 ENCOUNTER — Other Ambulatory Visit (HOSPITAL_BASED_OUTPATIENT_CLINIC_OR_DEPARTMENT_OTHER): Payer: Self-pay

## 2024-06-05 ENCOUNTER — Other Ambulatory Visit (HOSPITAL_BASED_OUTPATIENT_CLINIC_OR_DEPARTMENT_OTHER): Payer: Self-pay

## 2024-06-05 DIAGNOSIS — G894 Chronic pain syndrome: Secondary | ICD-10-CM | POA: Diagnosis not present

## 2024-06-05 DIAGNOSIS — M47816 Spondylosis without myelopathy or radiculopathy, lumbar region: Secondary | ICD-10-CM | POA: Diagnosis not present

## 2024-06-05 DIAGNOSIS — M170 Bilateral primary osteoarthritis of knee: Secondary | ICD-10-CM | POA: Diagnosis not present

## 2024-06-05 DIAGNOSIS — M48061 Spinal stenosis, lumbar region without neurogenic claudication: Secondary | ICD-10-CM | POA: Diagnosis not present

## 2024-06-05 MED ORDER — OXYCODONE HCL 10 MG PO TABS
10.0000 mg | ORAL_TABLET | Freq: Three times a day (TID) | ORAL | 0 refills | Status: DC | PRN
  Filled 2024-09-10: qty 90, 30d supply, fill #0

## 2024-06-05 MED ORDER — OXYCODONE HCL 10 MG PO TABS
10.0000 mg | ORAL_TABLET | Freq: Three times a day (TID) | ORAL | 0 refills | Status: DC | PRN
  Filled 2024-06-05 – 2024-07-13 (×2): qty 90, 30d supply, fill #0

## 2024-06-06 ENCOUNTER — Other Ambulatory Visit (HOSPITAL_BASED_OUTPATIENT_CLINIC_OR_DEPARTMENT_OTHER): Payer: Self-pay

## 2024-06-11 ENCOUNTER — Ambulatory Visit: Payer: Commercial Managed Care - PPO | Admitting: Family Medicine

## 2024-06-19 ENCOUNTER — Ambulatory Visit: Payer: Self-pay | Admitting: Nurse Practitioner

## 2024-06-20 ENCOUNTER — Other Ambulatory Visit: Payer: Self-pay | Admitting: Internal Medicine

## 2024-06-20 MED ORDER — XOFLUZA (80 MG DOSE) 1 X 80 MG PO TBPK: ORAL_TABLET | ORAL | 0 refills | Status: AC

## 2024-06-22 ENCOUNTER — Other Ambulatory Visit: Payer: Self-pay

## 2024-06-22 ENCOUNTER — Encounter (HOSPITAL_BASED_OUTPATIENT_CLINIC_OR_DEPARTMENT_OTHER): Payer: Self-pay | Admitting: Emergency Medicine

## 2024-06-22 ENCOUNTER — Emergency Department (HOSPITAL_BASED_OUTPATIENT_CLINIC_OR_DEPARTMENT_OTHER)
Admission: EM | Admit: 2024-06-22 | Discharge: 2024-06-22 | Disposition: A | Discharge status: Home / Self Care | Attending: Emergency Medicine | Admitting: Emergency Medicine

## 2024-06-22 DIAGNOSIS — G43009 Migraine without aura, not intractable, without status migrainosus: Secondary | ICD-10-CM | POA: Diagnosis not present

## 2024-06-22 LAB — CBC
HCT: 41.1 % (ref 36.0–46.0)
Hemoglobin: 13.8 g/dL (ref 12.0–15.0)
MCH: 28.9 pg (ref 26.0–34.0)
MCHC: 33.6 g/dL (ref 30.0–36.0)
MCV: 86 fL (ref 80.0–100.0)
Platelets: 260 K/uL (ref 150–400)
RBC: 4.78 MIL/uL (ref 3.87–5.11)
RDW: 11.9 % (ref 11.5–15.5)
WBC: 4.8 K/uL (ref 4.0–10.5)
nRBC: 0 % (ref 0.0–0.2)

## 2024-06-22 LAB — BASIC METABOLIC PANEL WITH GFR
Anion gap: 13 (ref 5–15)
BUN: 12 mg/dL (ref 6–20)
CO2: 28 mmol/L (ref 22–32)
Calcium: 10.1 mg/dL (ref 8.9–10.3)
Chloride: 102 mmol/L (ref 98–111)
Creatinine, Ser: 0.92 mg/dL (ref 0.44–1.00)
GFR, Estimated: >60 mL/min (ref >60)
Glucose, Bld: 135 mg/dL — ABNORMAL HIGH (ref 70–99)
Potassium: 3.8 mmol/L (ref 3.5–5.1)
Sodium: 142 mmol/L (ref 135–145)

## 2024-06-22 MED ORDER — SODIUM CHLORIDE 0.9 % IV BOLUS
1000.0000 mL | Freq: Once | INTRAVENOUS | Status: AC
  Administered 2024-06-22: 1000 mL via INTRAVENOUS

## 2024-06-22 MED ORDER — METOCLOPRAMIDE HCL 5 MG/ML IJ SOLN
10.0000 mg | Freq: Once | INTRAMUSCULAR | Status: AC
  Administered 2024-06-22: 10 mg via INTRAVENOUS
  Filled 2024-06-22: qty 2

## 2024-06-22 MED ORDER — KETOROLAC TROMETHAMINE 30 MG/ML IJ SOLN
15.0000 mg | Freq: Once | INTRAMUSCULAR | Status: AC
  Administered 2024-06-22: 15 mg via INTRAVENOUS
  Filled 2024-06-22: qty 1

## 2024-06-22 NOTE — ED Provider Notes (Signed)
 [place] [facility] Provider Note   CSN: [id number] Arrival date & time: 06/22/24  1526     Patient presents with: Headache   Jasmin Swanson is a 53 y.o. female.  With a history of migraines who presents to the ED for headache.  Patient reports recently testing positive for the flu.  Positive home test 4 days ago.  No fevers body aches or respiratory symptoms or GI symptoms now.  Headache started 3 days ago gradually onset.  Feels similar to prior migraines.  Associated photophobia.  No neck pain focal weakness changes in speech.  She takes Xofluza  for migraines but this may provide minimal relief    Headache      Prior to Admission medications   Medication Sig Start Date End Date Taking? Authorizing Provider  Atogepant  (QULIPTA ) 60 MG TABS Take 1 tablet (60 mg total) by mouth daily. 03/22/24   Georgina Speaks, FNP  Baloxavir Marboxil ,80 MG Dose, (XOFLUZA , 80 MG DOSE,) 1 x 80 MG TBPK Single dose by mouth within 48 hours of symptom onset or exposure 06/20/24   Jarold Medici, MD  celecoxib  (CELEBREX ) 100 MG capsule Take 1 capsule (100 mg total) by mouth daily as needed. 10/11/23     celecoxib  (CELEBREX ) 100 MG capsule Take 1 capsule (100 mg total) by mouth daily as needed. 02/14/24     chlorthalidone  (HYGROTON ) 25 MG tablet Take 0.5 tablets (12.5 mg total) by mouth daily. Pt reports half a tablet 11/22/23   Tolia, Sunit, DO  losartan  (COZAAR ) 100 MG tablet Take 1 tablet (100 mg total) by mouth daily at 10 pm. 11/22/23   Tolia, Sunit, DO  ondansetron  (ZOFRAN -ODT) 4 MG disintegrating tablet Take 1 tablet (4 mg total) by mouth every 8 (eight) hours as needed for nausea or vomiting. 11/07/23   Sebastian Beverley NOVAK, MD  Oxycodone  HCl 10 MG TABS Take 1 tablet (10 mg total) by mouth 3 (three) times daily as needed. Patient not taking: Reported on 03/15/2024 12/12/23     Oxycodone  HCl 10 MG TABS Take 1 tablet by mouth three times a day as needed 02/14/24     Oxycodone  HCl  10 MG TABS Take 1 tablet (10 mg total) by mouth 3 (three) times daily as needed. 05/08/24     Oxycodone  HCl 10 MG TABS Take 1 tablet (10 mg total) by mouth 3 (three) times daily as needed. 04/10/24     Oxycodone  HCl 10 MG TABS Take 1 tablet by mouth three times a day as needed 06/05/24     Oxycodone  HCl 10 MG TABS Take 1 tablet by mouth three times a day as needed 07/04/24     pregabalin  (LYRICA ) 50 MG capsule Take 1 capsule (50 mg total) by mouth 2 (two) times daily as needed. Patient not taking: Reported on 03/15/2024 12/12/23     pregabalin  (LYRICA ) 50 MG capsule Take 1 capsule (50 mg total) by mouth 2 (two) times daily as needed. 04/10/24     rosuvastatin  (CRESTOR ) 5 MG tablet Take 1 tablet (5 mg total) by mouth daily. Patient taking differently: Take 5 mg by mouth daily. Patient reports taking MWF 05/23/23 06/28/24  Tolia, Sunit, DO  Topiramate  ER (TROKENDI  XR) 200 MG CP24 Take 1 capsule (200 mg total) by mouth daily at 12 noon. 11/07/23 11/01/24  Sebastian Beverley NOVAK, MD    Allergies: Bactrim [sulfamethoxazole-trimethoprim] and Oxycodone -acetaminophen    Review of Systems  Neurological:  Positive for headaches.    Updated Vital  Signs BP (!) 147/80   Pulse 65   Temp 98.3 F (36.8 C)   Resp 18   Ht 5' 7 (1.702 m)   Wt 108.9 kg   LMP 09/25/2023   SpO2 99%   BMI 37.59 kg/m   Physical Exam Vitals and nursing note reviewed.  HENT:     Head: Normocephalic and atraumatic.  Eyes:     Pupils: Pupils are equal, round, and reactive to light.  Cardiovascular:     Rate and Rhythm: Normal rate and regular rhythm.  Pulmonary:     Effort: Pulmonary effort is normal.     Breath sounds: Normal breath sounds.  Abdominal:     Palpations: Abdomen is soft.     Tenderness: There is no abdominal tenderness.  Skin:    General: Skin is warm and dry.  Neurological:     Mental Status: She is alert and oriented to person, place, and time. Mental status is at baseline.     Sensory: No sensory deficit.      Motor: No weakness.     Coordination: Coordination normal.  Psychiatric:        Mood and Affect: Mood normal.     (all labs ordered are listed, but only abnormal results are displayed) Labs Reviewed  BASIC METABOLIC PANEL WITH GFR - Abnormal; Notable for the following components:      Result Value   Glucose, Bld 135 (*)    All other components within normal limits  CBC    EKG: None  Radiology: No results found.   Procedures   Medications Ordered in the ED  sodium chloride  0.9 % bolus 1,000 mL (0 mLs Intravenous Stopped 06/22/24 1855)  ketorolac  (TORADOL ) 30 MG/ML injection 15 mg (15 mg Intravenous Given 06/22/24 1754)  metoCLOPramide  (REGLAN ) injection 10 mg (10 mg Intravenous Given 06/22/24 1753)    Clinical Course as of 06/22/24 1910  Fri Jun 22, 2024  1909 Reevaluated patient after completion of IV fluids.  Completely resolved.  She will follow-up with PCP [MP]    Clinical Course User Index [MP] Pamella Ozell LABOR, DO                                 Medical Decision Making 53 year old female with history as above presented to the ED for 3 days of headache.  Feels similar to prior migraines.  Recently diagnosed with the flu.  Xofluza  at home was unaffected.  No focal neurologic deficit on my exam.  Afebrile well-appearing.  Resolved after Reglan .  Suspect migraine headache.  Low suspicion for stroke or TIA.  No fevers no neck pain.  Suspicion for meningitis or other acute infectious process.  Will allow her to complete IV fluids and reassess.  Basic laboratory workup looks okay.  Amount and/or Complexity of Data Reviewed Labs: ordered.  Risk Prescription drug management.        Final diagnoses:  Migraine without aura and without status migrainosus, not intractable    ED Discharge Orders     None          Pamella Ozell LABOR, DO 06/22/24 1910

## 2024-06-22 NOTE — ED Triage Notes (Signed)
 Pt reports hx HTN, dx with Flu on Mon  reports HA and high blood pressure since Wed  takes BP med (Chlorthalidone  12.5mg  QAM & Losartan  100mg  at bedtime)  Denies any CP, ShoB, diaphoresis, or other s/s

## 2024-06-22 NOTE — Discharge Instructions (Signed)
 You were seen in the emerged ferment for migraine headache Your symptoms improved after Reglan  Toradol  and IV fluids Your blood work was okay This may be result of your recent flu Continue taking all previous Medications at home Return to the emergency room for severe headaches or any other concerns

## 2024-06-26 ENCOUNTER — Other Ambulatory Visit: Payer: Self-pay | Admitting: Cardiology

## 2024-06-26 ENCOUNTER — Other Ambulatory Visit (HOSPITAL_BASED_OUTPATIENT_CLINIC_OR_DEPARTMENT_OTHER): Payer: Self-pay

## 2024-06-26 DIAGNOSIS — R931 Abnormal findings on diagnostic imaging of heart and coronary circulation: Secondary | ICD-10-CM

## 2024-06-26 MED ORDER — ROSUVASTATIN CALCIUM 5 MG PO TABS
5.0000 mg | ORAL_TABLET | Freq: Every day | ORAL | 1 refills | Status: AC
  Filled 2024-06-26: qty 90, 90d supply, fill #0
  Filled 2024-10-08: qty 90, 90d supply, fill #1

## 2024-07-03 ENCOUNTER — Inpatient Hospital Stay: Admitting: Family Medicine

## 2024-07-10 ENCOUNTER — Other Ambulatory Visit: Payer: Self-pay

## 2024-07-10 ENCOUNTER — Encounter: Payer: Self-pay | Admitting: Family Medicine

## 2024-07-10 ENCOUNTER — Other Ambulatory Visit (HOSPITAL_BASED_OUTPATIENT_CLINIC_OR_DEPARTMENT_OTHER): Payer: Self-pay

## 2024-07-10 ENCOUNTER — Ambulatory Visit: Admitting: Family Medicine

## 2024-07-10 VITALS — BP 103/66 | HR 90 | Temp 97.2°F | Resp 18 | Wt 237.0 lb

## 2024-07-10 DIAGNOSIS — I10 Essential (primary) hypertension: Secondary | ICD-10-CM

## 2024-07-10 DIAGNOSIS — G43009 Migraine without aura, not intractable, without status migrainosus: Secondary | ICD-10-CM

## 2024-07-10 MED ORDER — CANDESARTAN CILEXETIL 32 MG PO TABS
32.0000 mg | ORAL_TABLET | Freq: Every day | ORAL | 0 refills | Status: DC
  Filled 2024-07-10: qty 90, 90d supply, fill #0

## 2024-07-10 NOTE — Progress Notes (Signed)
 Assessment & Plan   Assessment/Plan:   Assessment & Plan Hypertension with recent blood pressure fluctuations Intermittent hypertension with recent fluctuations, potentially exacerbated by flu and migraine. Current regimen includes losartan  100 mg at night and chlorthalidone  12.5 mg daily, though she has been taking 25 mg of chlorthalidone . Blood pressure today was low, with readings in the 90s/60s. - Switch losartan  to candesartan  32 mg daily for blood pressure control and potential migraine prevention. - Continue chlorthalidone  at 12.5 mg daily. - Monitor blood pressure once daily and bring home cuff to next appointment for calibration. - Consider amlodipine if further blood pressure control is needed.  Migraine with increased frequency Increased frequency of migraines, possibly exacerbated by recent flu and stress. Current management includes Topamax , but recent breakthrough headaches noted. - Switch to candesartan  32 mg daily for potential migraine prevention. - Monitor for reduction in migraine frequency.      Medications Discontinued During This Encounter  Medication Reason   losartan  (COZAAR ) 100 MG tablet     Return in about 1 month (around 08/09/2024) for BP.        Subjective:   Encounter date: 07/10/2024  Jasmin Swanson is a 53 y.o. female who has Migraine, unspecified, not intractable, without status migrainosus; Primary hypertension; RUQ pain; Gall stones; Ascending aorta dilatation (HCC); Coronary artery disease due to calcified coronary lesion; Chronic low back pain; Metabolic dysfunction-associated steatotic liver disease (MASLD); Vitamin D  deficiency; Prediabetes; Class 2 severe obesity due to excess calories with serious comorbidity and body mass index (BMI) of 37.0 to 37.9 in adult [facility]); and Other neutropenia (HCC) on their problem list..   She  has a past medical history of Coronary artery calcification, Hypertension, Migraine, and Obesity (BMI 30-39.9)..    She presents with chief complaint of Hospitalization Follow-up (ED follow 06/22/2024 for migraines and flu. Pt stated migraines have improved. //HM due- vaccinations ) and Hypertension (Pt have concerns with fluctuating blood pressure reading. Pt stated sometimes readings are high and low. Pt is currently taking chlorthalidone  25 MG tablet) .   Discussed the use of AI scribe software for clinical note transcription with the patient, who gave verbal consent to proceed.  History of Present Illness Jasmin Swanson is a 53 year old female with hypertension and migraines who presents for follow-up on blood pressure management.  Hypertension - Intermittent hypertension with recent lower blood pressure readings at home - Two weeks ago, during an episode of influenza, blood pressure increased to 140/100 mmHg and 150/90 mmHg, associated with severe headache and insomnia - Took an amlodipine tablet not prescribed to her, resulting in further blood pressure elevation to 170/?, followed by emergency department visit - Received IV fluids, Toradol , and Reglan  in the emergency department; blood pressure subsequently decreased to the 110s - Current antihypertensive regimen: losartan  100 mg nightly and chlorthalidone  12.5 mg daily - Initially on chlorthalidone  25 mg in the morning and losartan  100 mg at night; experienced dizziness at work with blood pressure in the 90s, leading to dose reduction or omission of chlorthalidone  - Recently resumed regular use of chlorthalidone  25 mg daily - Missed a dose of antihypertensive medication on a Saturday, resulting in elevated blood pressure the following day - Blood pressure increases significantly during periods of stress, such as traveling and rushing to the airport - Occasionally uses amlodipine from her husband's supply during hypertensive episodes  Headache and migraine symptoms - History of migraines, previously well controlled with Topamax  or Trokendi  - Recent  increase in frequency of breakthrough  headaches, possibly related to menopause - Severe headache associated with hypertensive episode during recent influenza illness - Able to go without migraines for years, but headaches have been recurring more frequently  Adverse effects of antihypertensive therapy - Experienced dizziness at work when taking chlorthalidone  25 mg in the morning, with blood pressure readings in the 90s - Reduced or skipped chlorthalidone  dose due to dizziness     ROS  Past Surgical History:  Procedure Laterality Date   CESAREAN SECTION  2003   CESAREAN SECTION  2006    Outpatient Medications Prior to Visit  Medication Sig Dispense Refill   Atogepant  (QULIPTA ) 60 MG TABS Take 1 tablet (60 mg total) by mouth daily. 90 tablet 1   Baloxavir Marboxil ,80 MG Dose, (XOFLUZA , 80 MG DOSE,) 1 x 80 MG TBPK Single dose by mouth within 48 hours of symptom onset or exposure 1 each 0   celecoxib  (CELEBREX ) 100 MG capsule Take 1 capsule (100 mg total) by mouth daily as needed. 30 capsule 0   celecoxib  (CELEBREX ) 100 MG capsule Take 1 capsule (100 mg total) by mouth daily as needed. 30 capsule 0   chlorthalidone  (HYGROTON ) 25 MG tablet Take 0.5 tablets (12.5 mg total) by mouth daily. Pt reports half a tablet 90 tablet 3   ondansetron  (ZOFRAN -ODT) 4 MG disintegrating tablet Take 1 tablet (4 mg total) by mouth every 8 (eight) hours as needed for nausea or vomiting. 20 tablet 0   Oxycodone  HCl 10 MG TABS Take 1 tablet by mouth three times a day as needed 90 tablet 0   Oxycodone  HCl 10 MG TABS Take 1 tablet (10 mg total) by mouth 3 (three) times daily as needed. 90 tablet 0   Oxycodone  HCl 10 MG TABS Take 1 tablet (10 mg total) by mouth 3 (three) times daily as needed. 90 tablet 0   Oxycodone  HCl 10 MG TABS Take 1 tablet by mouth three times a day as needed 90 tablet 0   Oxycodone  HCl 10 MG TABS Take 1 tablet by mouth three times a day as needed 90 tablet 0   pregabalin  (LYRICA ) 50 MG  capsule Take 1 capsule (50 mg total) by mouth 2 (two) times daily as needed. 60 capsule 3   rosuvastatin  (CRESTOR ) 5 MG tablet Take 1 tablet (5 mg total) by mouth daily. 90 tablet 1   Topiramate  ER (TROKENDI  XR) 200 MG CP24 Take 1 capsule (200 mg total) by mouth daily at 12 noon. 90 capsule 3   losartan  (COZAAR ) 100 MG tablet Take 1 tablet (100 mg total) by mouth daily at 10 pm. 90 tablet 3   Oxycodone  HCl 10 MG TABS Take 1 tablet (10 mg total) by mouth 3 (three) times daily as needed. (Patient not taking: Reported on 03/15/2024) 90 tablet 0   pregabalin  (LYRICA ) 50 MG capsule Take 1 capsule (50 mg total) by mouth 2 (two) times daily as needed. (Patient not taking: Reported on 07/10/2024) 60 capsule 3   No facility-administered medications prior to visit.    Family History  Problem Relation Age of Onset   Hypertension Mother    Diabetes Mother    Diabetes Father    Hypertension Father    Hypertension Sister    Hypertension Brother     Social History   Socioeconomic History   Marital status: Married    Spouse name: Not on file   Number of children: Not on file   Years of education: Not on file   Highest education  level: Not on file  Occupational History   Not on file  Tobacco Use   Smoking status: Never    Passive exposure: Never   Smokeless tobacco: Never  Vaping Use   Vaping status: Never Used  Substance and Sexual Activity   Alcohol use: Never   Drug use: Never   Sexual activity: Not on file  Other Topics Concern   Not on file  Social History Narrative   Not on file   Social Drivers of Health   Financial Resource Strain: Not on file  Food Insecurity: Not on file  Transportation Needs: Not on file  Physical Activity: Not on file  Stress: Not on file  Social Connections: Not on file  Intimate Partner Violence: Not on file                                                                                                  Objective:  Physical Exam: BP 103/66 (BP  Location: Right Arm, Patient Position: Sitting, Cuff Size: Large) Comment: recheck  Pulse 90   Temp (!) 97.2 F (36.2 C) (Temporal)   Resp 18   Wt 237 lb (107.5 kg)   LMP 09/25/2023   SpO2 98%   BMI 37.12 kg/m    Physical Exam GENERAL: Alert, cooperative, well developed, no acute distress HEENT: Normocephalic, normal oropharynx, moist mucous membranes CHEST: Clear to auscultation bilaterally, no wheezes, rhonchi, or crackles CARDIOVASCULAR: Normal heart rate and rhythm, S1 and S2 normal without murmurs ABDOMEN: Soft, non-tender, non-distended, without organomegaly, normal bowel sounds EXTREMITIES: No cyanosis, edema, or leg swelling NEUROLOGICAL: Cranial nerves grossly intact, moves all extremities without gross motor or sensory deficit   Physical Exam  No results found.  Recent Results (from the past 2160 hours)  Basic metabolic panel     Status: Abnormal   Collection Time: 06/22/24  3:48 PM  Result Value Ref Range   Sodium 142 135 - 145 mmol/L   Potassium 3.8 3.5 - 5.1 mmol/L   Chloride 102 98 - 111 mmol/L   CO2 28 22 - 32 mmol/L   Glucose, Bld 135 (H) 70 - 99 mg/dL    Comment: Glucose reference range applies only to samples taken after fasting for at least 8 hours.   BUN 12 6 - 20 mg/dL   Creatinine, Ser 9.07 0.44 - 1.00 mg/dL   Calcium  10.1 8.9 - 10.3 mg/dL   GFR, Estimated >39 >39 mL/min    Comment: (NOTE) Calculated using the CKD-EPI Creatinine Equation (2021)    Anion gap 13 5 - 15    Comment: Performed at [facility], [address] [place], [place] [address]  CBC     Status: None   Collection Time: 06/22/24  3:48 PM  Result Value Ref Range   WBC 4.8 4.0 - 10.5 K/uL   RBC 4.78 3.87 - 5.11 MIL/uL   Hemoglobin 13.8 12.0 - 15.0 g/dL   HCT 58.8 63.9 - 53.9 %   MCV 86.0 80.0 - 100.0 fL   MCH 28.9 26.0 - 34.0 pg   MCHC 33.6 30.0 -  36.0 g/dL   RDW 88.0 88.4 - 84.4 %   Platelets 260 150 - 400 K/uL   nRBC 0.0 0.0 - 0.2 %    Comment: Performed at  [facility], [address] [place], [place] [address]        Beverley Adine Hummer, MD, MS

## 2024-07-13 ENCOUNTER — Other Ambulatory Visit (HOSPITAL_BASED_OUTPATIENT_CLINIC_OR_DEPARTMENT_OTHER): Payer: Self-pay

## 2024-07-17 ENCOUNTER — Other Ambulatory Visit (HOSPITAL_BASED_OUTPATIENT_CLINIC_OR_DEPARTMENT_OTHER): Payer: Self-pay

## 2024-07-17 DIAGNOSIS — M48061 Spinal stenosis, lumbar region without neurogenic claudication: Secondary | ICD-10-CM | POA: Diagnosis not present

## 2024-07-17 DIAGNOSIS — M170 Bilateral primary osteoarthritis of knee: Secondary | ICD-10-CM | POA: Diagnosis not present

## 2024-07-17 DIAGNOSIS — M47816 Spondylosis without myelopathy or radiculopathy, lumbar region: Secondary | ICD-10-CM | POA: Diagnosis not present

## 2024-07-17 DIAGNOSIS — G894 Chronic pain syndrome: Secondary | ICD-10-CM | POA: Diagnosis not present

## 2024-07-17 MED ORDER — PREGABALIN 50 MG PO CAPS
50.0000 mg | ORAL_CAPSULE | Freq: Two times a day (BID) | ORAL | 3 refills | Status: AC | PRN
  Filled 2024-07-17 – 2024-07-24 (×2): qty 60, 30d supply, fill #0

## 2024-07-17 MED ORDER — OXYCODONE HCL 10 MG PO TABS
10.0000 mg | ORAL_TABLET | Freq: Three times a day (TID) | ORAL | 0 refills | Status: DC | PRN
  Filled 2024-07-17 – 2024-08-10 (×2): qty 90, 30d supply, fill #0

## 2024-07-24 ENCOUNTER — Other Ambulatory Visit (HOSPITAL_BASED_OUTPATIENT_CLINIC_OR_DEPARTMENT_OTHER): Payer: Self-pay

## 2024-07-24 ENCOUNTER — Encounter: Payer: Self-pay | Admitting: Cardiology

## 2024-07-24 ENCOUNTER — Ambulatory Visit: Attending: Cardiology | Admitting: Cardiology

## 2024-07-24 ENCOUNTER — Other Ambulatory Visit (HOSPITAL_COMMUNITY): Payer: Self-pay

## 2024-07-24 VITALS — BP 118/78 | HR 63 | Resp 16 | Ht 67.0 in | Wt 240.0 lb

## 2024-07-24 DIAGNOSIS — E782 Mixed hyperlipidemia: Secondary | ICD-10-CM

## 2024-07-24 DIAGNOSIS — I10 Essential (primary) hypertension: Secondary | ICD-10-CM

## 2024-07-24 DIAGNOSIS — G4730 Sleep apnea, unspecified: Secondary | ICD-10-CM

## 2024-07-24 DIAGNOSIS — R931 Abnormal findings on diagnostic imaging of heart and coronary circulation: Secondary | ICD-10-CM | POA: Diagnosis not present

## 2024-07-24 DIAGNOSIS — I77810 Thoracic aortic ectasia: Secondary | ICD-10-CM | POA: Diagnosis not present

## 2024-07-24 MED ORDER — OLMESARTAN MEDOXOMIL 40 MG PO TABS
40.0000 mg | ORAL_TABLET | Freq: Every evening | ORAL | 3 refills | Status: AC
  Filled 2024-07-24 – 2024-08-10 (×2): qty 90, 90d supply, fill #0
  Filled 2024-11-09: qty 90, 90d supply, fill #1

## 2024-07-24 NOTE — Progress Notes (Signed)
 Cardiology Office Note:  .   Date:  07/24/2024  ID:  Jasmin Swanson, DOB [birth year], MRN [id number] PCP:  Sebastian Beverley NOVAK, MD  Former Cardiology Providers: None [facility] Providers Cardiologist:  Madonna Large, DO , [facility] (established care 02/12/2023) Electrophysiologist:  None  Click to update primary MD,subspecialty MD or APP then REFRESH:1}    Chief Complaint  Patient presents with   Follow-up    Coronary calcification, reviewed test results    History of Present Illness: .   Jasmin Swanson is a 53 y.o.  female whose past medical history and cardiovascular risk factors includes: hypertension, mild coronary artery calcification, hx of ascending aortic dilatation (40 mm June 2024, follow up CT June 2025 38mm), obesity due to excess calories.   Patient was referred to the practice for evaluation of chest pain and blood pressure management.  Benign essential hypertension: Prior to establishing care her blood pressures would range between 160-170 mmHg and with titration of antihypertensive medications, lifestyle changes, her blood pressures have improved significantly.  Precordial pain: In th past her initial symptoms of chest pain felt to be both cardiac and noncardiac.  She did undergo coronary calcium  score which noted mild CAC, echocardiogram illustrated preserved LVEF without any significant valvular heart disease or wall motion abnormalities.  GXT was equivocal for ischemia due to uninterpretable stress ECG due to motion artifact.  We discussed undergoing additional testing at the last office visit in January 2025; however, since improvement of her modifiable cardiovascular risk factors she was not having any more chest pain and therefore shared decision was to hold off on additional testing until clinical need arises.    In the interim she had a CT chest without contrast in June 2025 which noted a ascending aorta now measuring 38 mm and no additional CTs have been  warranted.  Patient presents today for 54-month follow-up visit.  Patient doing well overall from a cardiovascular standpoint. Denies anginal chest pain or heart failure symptoms. About a month ago she had an episode of flu during which time she noted elevated blood pressures.  Since then her numbers have improved.  She followed up with her PCP and was transitioned from losartan  to candesartan  with the impression that it should help with her migraines.  However, patient stated that her blood pressure started to trend low while taking candesartan  and she took the liberty to stop taking it.  One of her colleagues gave her Benicar 40 mg p.o. every afternoon and she has been taking it regularly between 20 mg dosage and 40 mg dosage based on her home blood pressure readings.  Patient states that she has not updated her PCP given her change in her ARB regimen.  She is requesting a refill on Benicar going forward.  Review of Systems: .   Review of Systems  Cardiovascular:  Negative for chest pain, claudication, irregular heartbeat, leg swelling, near-syncope, orthopnea, palpitations, paroxysmal nocturnal dyspnea and syncope.  Respiratory:  Negative for shortness of breath.   Hematologic/Lymphatic: Negative for bleeding problem.    Studies Reviewed:   EKG: EKG Interpretation Date/Time:  Tuesday July 24 2024 08:57:43 EDT Ventricular Rate:  61 PR Interval:  174 QRS Duration:  86 QT Interval:  382 QTC Calculation: 384 R Axis:   8  Text Interpretation: Normal sinus rhythm When compared with ECG of 22-Nov-2023 10:21, No significant change was found Confirmed by Large Madonna [phone]) on 07/24/2024 9:06:45 AM  Echocardiogram: 04/11/2023:  Normal LV systolic function with visual EF  60-65%. Left ventricle cavity is normal in size. Normal global wall motion. Normal diastolic filling  pattern, normal LAP. Moderate left ventricular hypertrophy.  Mild (Grade I) mitral regurgitation.  No prior study for  comparison.   Stress Testing: Exercise treadmill stress test 04/11/2023: Functional status: Poor.  Chest pain: No. Reason for stopping exercise: Fatigue/weakness Hypertensive response to exercise: Yes (rest 140/90, peak 200/110) Exercise time 2 minutes 58 seconds on Bruce protocol, achieved 4.64 METS, 86% of age-predicted maximum heart rate (APMHR).  Stress ECG equivocal for ischemia uninterpretable ST-T rate related/motion artifact.   If clinically indicated consider a different modality to evaluate for ischemia.  CT Cardiac Scoring: 04/04/2023 Left main 0. LAD 36. LCx 0. RCA 0. 1. Coronary calcium  score of 36. This was 94th percentile for age,gender, and race matched controls.  2. Evidence of mild ascending aortic dilation, 40 mm, onnon-contrasted study. Consider secondary imaging modality (echocardiogram, CTA Aorta Protocol, MRA Aorta Protocol) if clinically indicated.  Noncardiac findings: No acute or unexpected extracardiac findings.   RADIOLOGY: CT CHEST WO CONTRAST 03/26/2024 Relatively stable ascending aorta measuring at 3.8 cm. No other focal abnormality is seen.  Risk Assessment/Calculations:   N/A   Labs:       Latest Ref Rng & Units 06/22/2024    3:48 PM 12/12/2023    1:48 PM 10/01/2020    5:48 PM  CBC  WBC 4.0 - 10.5 K/uL 4.8  3.9  7.1   Hemoglobin 12.0 - 15.0 g/dL 86.1  86.8  88.6   Hematocrit 36.0 - 46.0 % 41.1  40.1  36.9   Platelets 150 - 400 K/uL 260  206.0  433        Latest Ref Rng & Units 06/22/2024    3:48 PM 11/21/2023    3:50 PM 05/17/2023    8:41 AM  BMP  Glucose 70 - 99 mg/dL 864  882  97   BUN 6 - 20 mg/dL 12  9  17    Creatinine 0.44 - 1.00 mg/dL 9.07  9.16  9.05   BUN/Creat Ratio 9 - 23  11  18    Sodium 135 - 145 mmol/L 142  144  142   Potassium 3.5 - 5.1 mmol/L 3.8  4.1  3.9   Chloride 98 - 111 mmol/L 102  102  104   CO2 22 - 32 mmol/L 28   25   Calcium  8.9 - 10.3 mg/dL 89.8  89.8  9.7       Latest Ref Rng & Units 06/22/2024     3:48 PM 11/21/2023    3:50 PM 05/17/2023    8:41 AM  CMP  Glucose 70 - 99 mg/dL 864  882  97   BUN 6 - 20 mg/dL 12  9  17    Creatinine 0.44 - 1.00 mg/dL 9.07  9.16  9.05   Sodium 135 - 145 mmol/L 142  144  142   Potassium 3.5 - 5.1 mmol/L 3.8  4.1  3.9   Chloride 98 - 111 mmol/L 102  102  104   CO2 22 - 32 mmol/L 28   25   Calcium  8.9 - 10.3 mg/dL 89.8  89.8  9.7   Total Protein 6.0 - 8.5 g/dL  7.3    Total Bilirubin 0.0 - 1.2 mg/dL  0.5    Alkaline Phos 44 - 121 IU/L  64    AST 0 - 40 IU/L  20      Lab Results  Component Value Date  CHOL 141 11/21/2023   HDL 51 11/21/2023   LDLCALC 74 11/21/2023   LDLDIRECT 78 11/21/2023   TRIG 86 11/21/2023   CHOLHDL 3.3 05/17/2023   No results for input(s): LIPOA in the last 8760 hours. No components found for: NTPROBNP No results for input(s): PROBNP in the last 8760 hours. Recent Labs    12/12/23 1348  TSH 0.84    Physical Exam:    Today's Vitals   07/24/24 0900  BP: 118/78  Pulse: 63  Resp: 16  SpO2: 99%  Weight: 240 lb (108.9 kg)  Height: 5' 7 (1.702 m)    Body mass index is 37.59 kg/m. Wt Readings from Last 3 Encounters:  07/24/24 240 lb (108.9 kg)  07/10/24 237 lb (107.5 kg)  06/22/24 240 lb (108.9 kg)    Physical Exam  Constitutional: No distress.  hemodynamically stable  Neck: No JVD present.  Cardiovascular: Normal rate, regular rhythm, S1 normal and S2 normal. Exam reveals no gallop, no S3 and no S4.  No murmur heard. Pulmonary/Chest: Effort normal and breath sounds normal. No stridor. She has no wheezes. She has no rales.  Abdominal: Soft. Bowel sounds are normal. She exhibits no distension. There is no abdominal tenderness.  Musculoskeletal:        General: No edema.     Cervical back: Neck supple.  Neurological: She is alert and oriented to person, place, and time. She has intact cranial nerves (2-12).  Skin: Skin is warm.   Impression & Recommendation(s):  Impression:   ICD-10-CM   1.  Agatston coronary artery calcium  score less than 100  R93.1 Basic metabolic panel with GFR    Magnesium    2. Essential hypertension  I10 EKG 12-Lead    [facility]    Basic metabolic panel with GFR    Magnesium    3. Ascending aorta dilatation  I77.810 EKG 12-Lead    Basic metabolic panel with GFR    Magnesium    4. Mixed hyperlipidemia  E78.2 Basic metabolic panel with GFR    Magnesium    5. Sleep apnea, unspecified type  G47.30        Recommendation(s):  Agatston coronary artery calcium  score less than 100 Total CAC 36, 94th percentile Denies any anginal chest pain or heart failure symptoms. EKG is nonischemic. Echo: Preserved LVEF, see report for additional details. GXT was inconclusive as the stress ECG was uninterpretable. No reoccurrence of precordial pain and patient would like to hold off on additional testing at this time, reasonable given the fact she is asymptomatic Reemphasize importance of improving her modifiable cardiovascular risk factors  Essential hypertension Prior to establishing care her SBP would range between 160-170 mmHg. Initially being managed by cardiology and then PCP. For continuity of care patient is advised to follow-up with one provider for simplicity and to avoid confusion She has stopped candesartan  due to soft blood pressures. One of her colleagues gave her Benicar which she is tolerated well, she is requesting a refill. Will refill Benicar 40 mg p.o. every afternoon. BMP in one week to check renal function and electrolytes Office blood pressures are overall controlled  Recommended evaluation for sleep apnea given her elevated STOP-BANG score as well as morning headaches and elevated blood pressures in the morning.   Continue chlorthalidone  12.5 mg p.o. every morning.  Ascending aorta dilatation [facility]) June 2024: Coronary calcium  score noted ascending aortic dilatation at 40 mm.  June 2025: CT chest w/o contrast noted ascending  aorta at  38mm  No additional follow up at this time.  Recommend a goal SBP <120 mmHg if able to tolerate.  Mixed hyperlipidemia Continue statin rosuvastatin  5 mg p.o. Monday Wednesday Friday LDL 74 mg/dL as of January 2025  Orders Placed:  Orders Placed This Encounter  Procedures   Basic metabolic panel with GFR    Standing Status:   Future    Expected Date:   07/31/2024    Expiration Date:   07/24/2025   Magnesium    Standing Status:   Future    Expected Date:   07/31/2024    Expiration Date:   07/24/2025   EKG 12-Lead   [facility] Study    Standing Status:   Future    Expected Date:   07/24/2024    Expiration Date:   07/24/2025    Does the patient have access to and comfortable using a smartphone or tablet?:   Yes - WatchPat One. Device is disposable    Final Medication List:    Meds ordered this encounter  Medications   olmesartan (BENICAR) 40 MG tablet    Sig: Take 1 tablet (40 mg total) by mouth every evening.    Dispense:  90 tablet    Refill:  3    Medications Discontinued During This Encounter  Medication Reason   candesartan  (ATACAND ) 32 MG tablet Patient Preference   olmesartan (BENICAR) 40 MG tablet Reorder      Current Outpatient Medications:    Baloxavir Marboxil ,80 MG Dose, (XOFLUZA , 80 MG DOSE,) 1 x 80 MG TBPK, Single dose by mouth within 48 hours of symptom onset or exposure, Disp: 1 each, Rfl: 0   celecoxib  (CELEBREX ) 100 MG capsule, Take 1 capsule (100 mg total) by mouth daily as needed., Disp: 30 capsule, Rfl: 0   chlorthalidone  (HYGROTON ) 25 MG tablet, Take 0.5 tablets (12.5 mg total) by mouth daily. Pt reports half a tablet, Disp: 90 tablet, Rfl: 3   ondansetron  (ZOFRAN -ODT) 4 MG disintegrating tablet, Take 1 tablet (4 mg total) by mouth every 8 (eight) hours as needed for nausea or vomiting., Disp: 20 tablet, Rfl: 0   Oxycodone  HCl 10 MG TABS, Take 1 tablet (10 mg total) by mouth 3 (three) times daily as needed., Disp: 90 tablet, Rfl: 0   Oxycodone   HCl 10 MG TABS, Take 1 tablet by mouth three times a day as needed, Disp: 90 tablet, Rfl: 0   Oxycodone  HCl 10 MG TABS, Take 1 tablet by mouth three times a day as needed, Disp: 90 tablet, Rfl: 0   Oxycodone  HCl 10 MG TABS, Take 1 tablet (10 mg total) by mouth 3 (three) times daily as needed., Disp: 90 tablet, Rfl: 0   pregabalin  (LYRICA ) 50 MG capsule, Take 1 capsule (50 mg total) by mouth 2 (two) times daily as needed., Disp: 60 capsule, Rfl: 3   pregabalin  (LYRICA ) 50 MG capsule, Take 1 capsule (50 mg total) by mouth 2 (two) times daily as needed., Disp: 60 capsule, Rfl: 3   pregabalin  (LYRICA ) 50 MG capsule, Take 1 capsule (50 mg total) by mouth 2 (two) times daily as needed., Disp: 60 capsule, Rfl: 3   rosuvastatin  (CRESTOR ) 5 MG tablet, Take 1 tablet (5 mg total) by mouth daily. (Patient taking differently: Take 5 mg by mouth every Monday, Wednesday, and Friday.), Disp: 90 tablet, Rfl: 1   Topiramate  ER (TROKENDI  XR) 200 MG CP24, Take 1 capsule (200 mg total) by mouth daily at 12 noon., Disp: 90 capsule, Rfl: 3  Atogepant  (QULIPTA ) 60 MG TABS, Take 1 tablet (60 mg total) by mouth daily. (Patient not taking: Reported on 07/24/2024), Disp: 90 tablet, Rfl: 1   celecoxib  (CELEBREX ) 100 MG capsule, Take 1 capsule (100 mg total) by mouth daily as needed. (Patient not taking: Reported on 07/24/2024), Disp: 30 capsule, Rfl: 0   olmesartan (BENICAR) 40 MG tablet, Take 1 tablet (40 mg total) by mouth every evening., Disp: 90 tablet, Rfl: 3   Oxycodone  HCl 10 MG TABS, Take 1 tablet (10 mg total) by mouth 3 (three) times daily as needed. (Patient not taking: Reported on 07/24/2024), Disp: 90 tablet, Rfl: 0   Oxycodone  HCl 10 MG TABS, Take 1 tablet (10 mg total) by mouth 3 (three) times daily as needed. (Patient not taking: Reported on 07/24/2024), Disp: 90 tablet, Rfl: 0   Oxycodone  HCl 10 MG TABS, Take 1 tablet by mouth three times a day as needed (Patient not taking: Reported on 07/24/2024), Disp: 90 tablet,  Rfl: 0  Consent:   NA  Disposition:   1 year follow-up sooner if needed  Her questions and concerns were addressed to her satisfaction. She voices understanding of the recommendations provided during this encounter.    Signed, Madonna Michele HAS, [facility]  A Division of [facility] [address], [place],  [address]  07/24/2024 10:48 AM

## 2024-07-24 NOTE — Patient Instructions (Addendum)
 Medication Instructions:  Your physician recommends that you continue on your current medications as directed. Please refer to the Current Medication list given to you today.  *If you need a refill on your cardiac medications before your next appointment, please call your pharmacy*  Lab Work: BMP and Mag-- In one week  You may go to any [facility] Location for your lab work:  [facility] - [address] ([facility]) - [phone] [address]  [place] - [address]   [place]  - [address]   [place] - [address] - [address] [employer]  - [address] - [address] ([facility]   If you have labs (blood work) drawn today and your tests are completely normal, you will receive your results only by: [employer] (if you have MyChart)  If you have any lab test that is abnormal or we need to change your treatment, we will call you or send a MyChart message to review the results.  Testing/Procedures: YOU HAVE BEEN PRESCRIBED A HOME SLEEP TEST   Q: Why did my Cardiologist order a Home Sleep Test?  A: There is a strong correlation between untreated sleep apnea and increased cardiovascular risk. Clinical Data shows untreated sleep apnea DOUBLES your risk for Stroke, Heart Attack, and All-Cause Mortality.   Q: What is Sleep Apnea?  A: Sleep apnea occurs when soft tissue blocks the airway repeatedly during sleep. Having untreated sleep apnea puts stress on the cardiovascular system and your heart goes without oxygen making your cardiac problems worse. Know that: "Sleep Apnea hurts [facility].E.A.R.T.S.."  Heart Failure, Elevated Blood Pressure, Atrial Fibrillation, Resistant Hypertension, Type 2 Diabetes, Stroke   Q: What happens now?  A: You will be receiving a WatchPAT home sleep test kit in your mailbox from [employer] and called by [phone] for  initial setup.   Q: What if I have questions about taking the test?  A: Call for instructions, with 24-hour availability of qualified personnel to answer any questions:   Patient Help Desk: [phone]   Once the device is delivered to your home complete the test that night.  In the morning, attach the prepaid shipping label included, and put it back in the mailbox if you received the non-disposable device. If you received disposable device please discard in the morning.   You will be contacted by your Sleep Physician if therapy is needed:  Dr. Wilbert Bihari  - Specializes in General Cardiology, Echocardiography and Cardiac Catheterization  Board Certified in Sleep Medicine    Patient Copy of Consent  I understand that I am proceeding with a home sleep apnea test as ordered by my physician. I understand that untreated sleep apnea is a serious cardiovascular risk factor and it is my responsibility to perform the test and seek management for sleep apnea. I will be receiving further instructions and equipment by mail from [employer] [phone]. I will be contacted with the results and be managed for sleep apnea by a local sleep physician. I understand my insurance will be billed for the test and as the patient I am responsible for any insurance related out-of-pocket costs incurred. I have been provided with written instructions and can call for additional video or telephonic instruction, with 24-hour availability of qualified personnel to answer any questions: Patient Help Desk [phone].   Follow-Up: At  [facility], you and your health needs are our priority.  As part of our continuing mission to provide you with exceptional heart care, we have created designated Provider Care Teams.  These Care Teams include your primary Cardiologist (physician) and Advanced Practice Providers (APPs -  Physician Assistants and Nurse Practitioners) who all work together to provide you with the care you  need, when you need it.   Your next appointment:   1 year  The format for your next appointment:   In Person  Provider:   Madonna Large, DO

## 2024-08-02 ENCOUNTER — Other Ambulatory Visit (HOSPITAL_COMMUNITY): Payer: Self-pay

## 2024-08-03 ENCOUNTER — Other Ambulatory Visit (HOSPITAL_BASED_OUTPATIENT_CLINIC_OR_DEPARTMENT_OTHER): Payer: Self-pay

## 2024-08-10 ENCOUNTER — Other Ambulatory Visit (HOSPITAL_BASED_OUTPATIENT_CLINIC_OR_DEPARTMENT_OTHER): Payer: Self-pay

## 2024-08-10 ENCOUNTER — Other Ambulatory Visit: Payer: Self-pay

## 2024-08-14 ENCOUNTER — Ambulatory Visit: Admitting: Family Medicine

## 2024-08-14 ENCOUNTER — Encounter: Payer: Self-pay | Admitting: Family Medicine

## 2024-08-14 VITALS — BP 124/82 | HR 61 | Temp 97.0°F | Resp 18 | Wt 238.0 lb

## 2024-08-14 DIAGNOSIS — G43009 Migraine without aura, not intractable, without status migrainosus: Secondary | ICD-10-CM

## 2024-08-14 DIAGNOSIS — I77810 Thoracic aortic ectasia: Secondary | ICD-10-CM

## 2024-08-14 DIAGNOSIS — I10 Essential (primary) hypertension: Secondary | ICD-10-CM | POA: Diagnosis not present

## 2024-08-14 MED ORDER — NURTEC 75 MG PO TBDP: 1.0000 | ORAL_TABLET | ORAL | 2 refills | Status: AC

## 2024-08-14 NOTE — Progress Notes (Signed)
 Assessment & Plan   Assessment/Plan:   Assessment & Plan Hypertension with aortic root dilatation Intermittent hypertension with aortic root dilatation. Blood pressure is well controlled with current regimen. Previous switch from losartan  to candesartan  resulted in hypotension. Currently on olmesartan 40 mg, but experiencing fluctuations in blood pressure. Aortic dilatation is stable, with recent CT showing improvement. - Continue olmesartan 40 mg, consider split dosing to 10 mg twice daily. - Hold chlorthalidone  for now, but keep as an option if needed.  Chronic back pain Chronic back pain has been poorly controlled, contributing to elevated blood pressure. Recent increase in pain medication dosage has improved pain control and subsequently improved blood pressure. Discussion of stigma associated with pain management and opioids. - Continue current pain management regimen with increased dosage. - Consider referral to pain management specialist if needed.  Migraine Previous use of candesartan  was intended to help with migraines. Nurtec has been effective for abortive therapy and is being considered for prophylaxis. Discussion of potential discontinuation of topiramate  due to long-term use and possible tolerance. - Prescribe Nurtec for prophylaxis and abortive therapy. - Refer to neurology for further migraine management. - Consider discontinuing topiramate  if Nurtec is effective.      Medications Discontinued During This Encounter  Medication Reason   Atogepant  (QULIPTA ) 60 MG TABS      No follow-ups on file.        Subjective:   Encounter date: 08/14/2024  Jasmin Swanson is a 53 y.o. female who has Migraine, unspecified, not intractable, without status migrainosus; Primary hypertension; RUQ pain; Gall stones; Ascending aorta dilatation; Coronary artery disease due to calcified coronary lesion; Chronic low back pain; Metabolic dysfunction-associated steatotic liver disease  (MASLD); Vitamin D  deficiency; Prediabetes; Class 2 severe obesity due to excess calories with serious comorbidity and body mass index (BMI) of 37.0 to 37.9 in adult; and Other neutropenia on their problem list..   She  has a past medical history of Coronary artery calcification, Hypertension, Migraine, and Obesity (BMI 30-39.9)..   She presents with chief complaint of Hypertension (1 month follow up. Pt is not fasting today. Pt blood pressure readings at home range from 124/82//HM due- vaccinations ) .   Discussed the use of AI scribe software for clinical note transcription with the patient, who gave verbal consent to proceed.  History of Present Illness Jasmin Swanson is a 53 year old female with hypertension and migraines who presents for follow-up on blood pressure management.  Hypertension - Intermittent hypertension with recent lower blood pressure readings at home - Two weeks ago, during an episode of influenza, blood pressure increased to 140/100 mmHg and 150/90 mmHg, associated with severe headache and insomnia - Took an amlodipine tablet not prescribed to her, resulting in further blood pressure elevation to 170/?, followed by emergency department visit - Received IV fluids, Toradol , and Reglan  in the emergency department; blood pressure subsequently decreased to the 110s - Current antihypertensive regimen: losartan  100 mg nightly and chlorthalidone  12.5 mg daily - Initially on chlorthalidone  25 mg in the morning and losartan  100 mg at night; experienced dizziness at work with blood pressure in the 90s, leading to dose reduction or omission of chlorthalidone  - Recently resumed regular use of chlorthalidone  25 mg daily - Missed a dose of antihypertensive medication on a Saturday, resulting in elevated blood pressure the following day - Blood pressure increases significantly during periods of stress, such as traveling and rushing to the airport - Occasionally uses amlodipine from her  husband's supply during hypertensive  episodes  Headache and migraine symptoms - History of migraines, previously well controlled with Topamax  or Trokendi  - Recent increase in frequency of breakthrough headaches, possibly related to menopause - Severe headache associated with hypertensive episode during recent influenza illness - Able to go without migraines for years, but headaches have been recurring more frequently  Adverse effects of antihypertensive therapy - Experienced dizziness at work when taking chlorthalidone  25 mg in the morning, with blood pressure readings in the 90s - Reduced or skipped chlorthalidone  dose due to dizziness   Jasmin Swanson is a 53 year old female with hypertension who presents for a follow-up visit.  Hypertension and antihypertensive medication tolerance - Blood pressure in clinic: 134/72 mmHg - Home blood pressure readings: typically around 124/82 mmHg - Recent switch from losartan  100 mg to candesartan  32 mg daily resulted in hypotension (upper 80s/50s) after taking half a tablet, attributed to not eating breakfast - Currently taking olmesartan 40 mg, split as 20 mg in the morning; morning blood pressure tends to be around 100/60 mmHg - No chest pain or shortness of breath - History of aortic dilatation; last CT reportedly showed improvement - Desires to maintain blood pressure control to prevent further aortic complications  Migraine and analgesic use - Significant improvement in migraine frequency since switching to candesartan , though hypotension limited its use - No recent migraine exacerbations or attacks - Olmesartan is more tolerable for migraine prevention - Longstanding use of migraine medication with suspected tolerance development - Increased pain medication dose to one and a half tablets, resulting in improved pain control and blood pressure  Musculoskeletal pain - History of back pain - Improved pain control with increased pain medication  dose  Recent infectious illness - Recent history of flu, now resolved - Currently feels much improved     ROS  Past Surgical History:  Procedure Laterality Date   CESAREAN SECTION  2003   CESAREAN SECTION  2006    Outpatient Medications Prior to Visit  Medication Sig Dispense Refill   Baloxavir Marboxil ,80 MG Dose, (XOFLUZA , 80 MG DOSE,) 1 x 80 MG TBPK Single dose by mouth within 48 hours of symptom onset or exposure 1 each 0   celecoxib  (CELEBREX ) 100 MG capsule Take 1 capsule (100 mg total) by mouth daily as needed. 30 capsule 0   chlorthalidone  (HYGROTON ) 25 MG tablet Take 0.5 tablets (12.5 mg total) by mouth daily. Pt reports half a tablet 90 tablet 3   olmesartan (BENICAR) 40 MG tablet Take 1 tablet (40 mg total) by mouth every evening. 90 tablet 3   ondansetron  (ZOFRAN -ODT) 4 MG disintegrating tablet Take 1 tablet (4 mg total) by mouth every 8 (eight) hours as needed for nausea or vomiting. 20 tablet 0   Oxycodone  HCl 10 MG TABS Take 1 tablet (10 mg total) by mouth 3 (three) times daily as needed. 90 tablet 0   Oxycodone  HCl 10 MG TABS Take 1 tablet by mouth three times a day as needed 90 tablet 0   pregabalin  (LYRICA ) 50 MG capsule Take 1 capsule (50 mg total) by mouth 2 (two) times daily as needed. 60 capsule 3   rosuvastatin  (CRESTOR ) 5 MG tablet Take 1 tablet (5 mg total) by mouth daily. (Patient taking differently: Take 5 mg by mouth every Monday, Wednesday, and Friday.) 90 tablet 1   Topiramate  ER (TROKENDI  XR) 200 MG CP24 Take 1 capsule (200 mg total) by mouth daily at 12 noon. 90 capsule 3   celecoxib  (CELEBREX ) 100 MG  capsule Take 1 capsule (100 mg total) by mouth daily as needed. (Patient not taking: Reported on 07/24/2024) 30 capsule 0   Oxycodone  HCl 10 MG TABS Take 1 tablet (10 mg total) by mouth 3 (three) times daily as needed. (Patient not taking: Reported on 07/24/2024) 90 tablet 0   Oxycodone  HCl 10 MG TABS Take 1 tablet (10 mg total) by mouth 3 (three) times daily  as needed. (Patient not taking: Reported on 07/24/2024) 90 tablet 0   Oxycodone  HCl 10 MG TABS Take 1 tablet by mouth three times a day as needed (Patient not taking: Reported on 07/24/2024) 90 tablet 0   Oxycodone  HCl 10 MG TABS Take 1 tablet by mouth three times a day as needed (Patient not taking: Reported on 08/14/2024) 90 tablet 0   Oxycodone  HCl 10 MG TABS Take 1 tablet (10 mg total) by mouth 3 (three) times daily as needed. 90 tablet 0   pregabalin  (LYRICA ) 50 MG capsule Take 1 capsule (50 mg total) by mouth 2 (two) times daily as needed. 60 capsule 3   pregabalin  (LYRICA ) 50 MG capsule Take 1 capsule (50 mg total) by mouth 2 (two) times daily as needed. (Patient not taking: Reported on 08/14/2024) 60 capsule 3   Atogepant  (QULIPTA ) 60 MG TABS Take 1 tablet (60 mg total) by mouth daily. (Patient not taking: Reported on 08/14/2024) 90 tablet 1   No facility-administered medications prior to visit.    Family History  Problem Relation Age of Onset   Hypertension Mother    Diabetes Mother    Diabetes Father    Hypertension Father    Hypertension Sister    Hypertension Brother     Social History   Socioeconomic History   Marital status: Married    Spouse name: Not on file   Number of children: Not on file   Years of education: Not on file   Highest education level: Not on file  Occupational History   Not on file  Tobacco Use   Smoking status: Never    Passive exposure: Never   Smokeless tobacco: Never  Vaping Use   Vaping status: Never Used  Substance and Sexual Activity   Alcohol use: Never   Drug use: Never   Sexual activity: Not on file  Other Topics Concern   Not on file  Social History Narrative   Not on file   Social Drivers of Health   Financial Resource Strain: Not on file  Food Insecurity: Not on file  Transportation Needs: Not on file  Physical Activity: Not on file  Stress: Not on file  Social Connections: Not on file  Intimate Partner Violence: Not on  file                                                                                                  Objective:  Physical Exam: BP 124/82 Comment: @home  reading  Pulse 61   Temp (!) 97 F (36.1 C) (Temporal)   Resp 18   Wt 238 lb (108 kg)   LMP 09/25/2023   SpO2 100%  BMI 37.28 kg/m    Physical Exam GENERAL: Alert, cooperative, well developed, no acute distress HEENT: Normocephalic, normal oropharynx, moist mucous membranes CHEST: Clear to auscultation bilaterally, no wheezes, rhonchi, or crackles CARDIOVASCULAR: Normal heart rate and rhythm, S1 and S2 normal without murmurs ABDOMEN: Soft, non-tender, non-distended, without organomegaly, normal bowel sounds EXTREMITIES: No cyanosis, edema, or leg swelling NEUROLOGICAL: Cranial nerves grossly intact, moves all extremities without gross motor or sensory deficit   GENERAL: Alert, cooperative, well developed, no acute distress. HEENT: Normocephalic, normal oropharynx, moist mucous membranes. CHEST: Clear to auscultation bilaterally, no wheezes, rhonchi, or crackles. CARDIOVASCULAR: Normal heart rate and rhythm, S1 and S2 normal without murmurs. ABDOMEN: Soft, non-tender, non-distended, without organomegaly, normal bowel sounds. EXTREMITIES: No cyanosis or edema. NEUROLOGICAL: Cranial nerves grossly intact, moves all extremities without gross motor or sensory deficit.   Physical Exam  No results found.  Recent Results (from the past 2160 hours)  Basic metabolic panel     Status: Abnormal   Collection Time: 06/22/24  3:48 PM  Result Value Ref Range   Sodium 142 135 - 145 mmol/L   Potassium 3.8 3.5 - 5.1 mmol/L   Chloride 102 98 - 111 mmol/L   CO2 28 22 - 32 mmol/L   Glucose, Bld 135 (H) 70 - 99 mg/dL    Comment: Glucose reference range applies only to samples taken after fasting for at least 8 hours.   BUN 12 6 - 20 mg/dL   Creatinine, Ser 9.07 0.44 - 1.00 mg/dL   Calcium  10.1 8.9 - 10.3 mg/dL   GFR, Estimated >39  >39 mL/min    Comment: (NOTE) Calculated using the CKD-EPI Creatinine Equation (2021)    Anion gap 13 5 - 15    Comment: Performed at [facility], [address] [place], [place] [address]  CBC     Status: None   Collection Time: 06/22/24  3:48 PM  Result Value Ref Range   WBC 4.8 4.0 - 10.5 K/uL   RBC 4.78 3.87 - 5.11 MIL/uL   Hemoglobin 13.8 12.0 - 15.0 g/dL   HCT 58.8 63.9 - 53.9 %   MCV 86.0 80.0 - 100.0 fL   MCH 28.9 26.0 - 34.0 pg   MCHC 33.6 30.0 - 36.0 g/dL   RDW 88.0 88.4 - 84.4 %   Platelets 260 150 - 400 K/uL   nRBC 0.0 0.0 - 0.2 %    Comment: Performed at [facility], [address] [place], [place] [address]        Beverley Adine Hummer, MD, MS

## 2024-08-16 ENCOUNTER — Other Ambulatory Visit (HOSPITAL_COMMUNITY): Payer: Self-pay

## 2024-08-16 ENCOUNTER — Telehealth: Payer: Self-pay

## 2024-08-16 NOTE — Telephone Encounter (Signed)
 Pharmacy Patient Advocate Encounter  Received notification from [facility] that Prior Authorization for Nurtec 75MG  dispersible tablets  has been APPROVED from 08/16/24 to 02/13/25. Ran test claim, Copay is $0. This test claim was processed through [facility]- copay amounts may vary at other pharmacies due to pharmacy/plan contracts, or as the patient moves through the different stages of their insurance plan.   PA #/Case ID/Reference #: [id number]

## 2024-08-16 NOTE — Telephone Encounter (Signed)
 Pharmacy Patient Advocate Encounter   Received notification from [employer] that prior authorization for Nurtec 75MG  dispersible tablets  is required/requested.   Insurance verification completed.   The patient is insured through [facility].   Per test claim: PA required; PA submitted to above mentioned insurance via Latent Key/confirmation #/EOC [id number] Status is pending

## 2024-08-21 ENCOUNTER — Other Ambulatory Visit (HOSPITAL_COMMUNITY): Payer: Self-pay

## 2024-09-10 ENCOUNTER — Other Ambulatory Visit (HOSPITAL_BASED_OUTPATIENT_CLINIC_OR_DEPARTMENT_OTHER): Payer: Self-pay

## 2024-09-10 ENCOUNTER — Other Ambulatory Visit: Payer: Self-pay

## 2024-09-11 ENCOUNTER — Other Ambulatory Visit (HOSPITAL_BASED_OUTPATIENT_CLINIC_OR_DEPARTMENT_OTHER): Payer: Self-pay

## 2024-09-13 ENCOUNTER — Encounter: Payer: Self-pay | Admitting: Neurology

## 2024-09-24 ENCOUNTER — Other Ambulatory Visit (HOSPITAL_BASED_OUTPATIENT_CLINIC_OR_DEPARTMENT_OTHER): Payer: Self-pay

## 2024-09-24 DIAGNOSIS — M170 Bilateral primary osteoarthritis of knee: Secondary | ICD-10-CM | POA: Diagnosis not present

## 2024-09-24 DIAGNOSIS — M47816 Spondylosis without myelopathy or radiculopathy, lumbar region: Secondary | ICD-10-CM | POA: Diagnosis not present

## 2024-09-24 DIAGNOSIS — G894 Chronic pain syndrome: Secondary | ICD-10-CM | POA: Diagnosis not present

## 2024-09-24 DIAGNOSIS — M48061 Spinal stenosis, lumbar region without neurogenic claudication: Secondary | ICD-10-CM | POA: Diagnosis not present

## 2024-09-24 MED ORDER — OXYCODONE HCL 10 MG PO TABS
10.0000 mg | ORAL_TABLET | Freq: Four times a day (QID) | ORAL | 0 refills | Status: AC | PRN
  Filled 2024-11-09: qty 120, 30d supply, fill #0

## 2024-09-24 MED ORDER — OXYCODONE HCL 10 MG PO TABS
10.0000 mg | ORAL_TABLET | Freq: Four times a day (QID) | ORAL | 0 refills | Status: DC | PRN
  Filled 2024-10-09: qty 120, 30d supply, fill #0

## 2024-10-08 ENCOUNTER — Other Ambulatory Visit (HOSPITAL_BASED_OUTPATIENT_CLINIC_OR_DEPARTMENT_OTHER): Payer: Self-pay

## 2024-10-09 ENCOUNTER — Other Ambulatory Visit (HOSPITAL_BASED_OUTPATIENT_CLINIC_OR_DEPARTMENT_OTHER): Payer: Self-pay

## 2024-10-15 NOTE — Progress Notes (Unsigned)
 "  NEUROLOGY CONSULTATION NOTE  Jasmin Swanson MRN: [id number] DOB: 1971/07/16  Referring provider: Beverley KATHEE Hummer, MD Primary care provider: Beverley KATHEE Hummer, MD  Reason for consult:  migraines  Assessment/Plan:   Migraine without aura, without status migrainosus, not intractable  Migraine prevention:  Plan is to start Nurtec 75mg  every other day as prescribed by her PCP.  She would like to avoid injections or other medications with long half lives in case she has an adverse reaction Migraine rescue:  She will try rizatriptan -MLT 10mg ; Zofran -ODT 4mg  for nausea. Lifestyle modification: Limit use of pain relievers to no more than 9 days out of the month to prevent risk of rebound or medication-overuse headache. Diet modification/hydration/caffeine cessation Routine exercise Sleep hygiene Consider vitamins/supplements:  magnesium citrate 400mg  daily, riboflavin 400mg  daily, CoQ10 100mg  three times daily Keep headache diary Follow up 6 months.    Subjective:  Jasmin Swanson is a 53 year old right-handed female who presents for migraines.  History supplemented by referring provider's note.  She began having migraines in her 14s.  They became more frequent and intractable in her early 30s, in which only Toradol  shots would abort them.  She started Trokendi  XR several years ago and her migraines significantly improved.  However, once she started menopause in 2022 or 2023, she had increase in migraine frequency.  Migraines are severe, unilateral (unsure side of lateralization) and sharp.  Associated with photophobia, phonophobia and sometimes nausea.  No associated vomiting, visual disturbance, autonomic symptoms or unilateral numbness or weakness.  They typically last a day but last 15 minutes with Nurtec.  They occur 4 times a month.  No preceding aura or prodrome.  Triggers include sleep deprivation, other pain, chewing gum, and riding as a passenger in a car.  Rest helps relieve them.   Routine activity aggravates them.    Past NSAIDS/analgesics:  Toradol  IM Past abortive triptans:  sumatriptan tab Past abortive ergotamine:  none Past muscle relaxants:  none Past anti-emetic:  none Past antihypertensive medications:  propranolol Past antidepressant medications:  none Past anticonvulsant medications:  topiramate  IR (ineffective) Past anti-CGRP:  Ubrelvy, Qulipta  (caused nausea and lightheadedness) Past vitamins/Herbal/Supplements:  none Past antihistamines/decongestants:  none Other past therapies:  none  Current NSAIDS/analgesics:  celecoxib , oxycodone  (back pain) Current triptans:  none Current ergotamine:  none Current anti-emetic:  Zofran -ODT 4mg  Current muscle relaxants:  none Current Antihypertensive medications:  olmesartan  Current Antidepressant medications:  none Current Anticonvulsant medications:  Trokendi  XR 200mg  daily, pregablin Current anti-CGRP:  Nurtec 75mg  every other day (prescribed by her PCP - did not pick it up - unaware a prescription was sent) Other medications:  chlorthalidone    Caffeine:  occasional decaf Exercise:  no Depression:  no; Anxiety:  no Other pain:  back pain Sleep hygiene:  variable  History of TBI/concussion:  no Family history of headache:  mother Family history of cerebral aneurysm:  no      PAST MEDICAL HISTORY: Past Medical History:  Diagnosis Date   Coronary artery calcification    Hypertension    Migraine    Obesity (BMI 30-39.9)     PAST SURGICAL HISTORY: Past Surgical History:  Procedure Laterality Date   CESAREAN SECTION  2003   CESAREAN SECTION  2006    MEDICATIONS: Medications Ordered Prior to Encounter[1]  ALLERGIES: Allergies[2]  FAMILY HISTORY: Family History  Problem Relation Age of Onset   Hypertension Mother    Diabetes Mother    Diabetes Father    Hypertension Father  Hypertension Sister    Hypertension Brother     Objective:  Blood pressure 126/78, pulse 73, height  5' 7 (1.702 m), weight 232 lb 7.2 oz (105.4 kg), last menstrual period 09/25/2023, SpO2 99%. General: No acute distress.  Patient appears well-groomed.   Head:  Normocephalic/atraumatic Eyes:  fundi examined but not visualized Neck: supple, no paraspinal tenderness, full range of motion Heart: regular rate and rhythm Neurological Exam: Mental status: alert and oriented to person, place, and time, speech fluent and not dysarthric, language intact. Cranial nerves: CN I: not tested CN II: pupils equal, round and reactive to light, visual fields intact CN III, IV, VI:  full range of motion, no nystagmus, no ptosis CN V: facial sensation intact. CN VII: upper and lower face symmetric CN VIII: hearing intact CN IX, X: gag intact, uvula midline CN XI: sternocleidomastoid and trapezius muscles intact CN XII: tongue midline Bulk & Tone: normal, no fasciculations. Motor:  muscle strength 5/5 throughout Sensation:  Pinprick and vibratory sensation intact. Deep Tendon Reflexes:  2+ throughout,  toes downgoing.   Finger to nose testing:  Without dysmetria.   Gait:  Normal station and stride.  Romberg negative.   Juliene Dunnings, DO  CC: Beverley KATHEE Hummer, MD        [1]  Current Outpatient Medications on File Prior to Visit  Medication Sig Dispense Refill   Baloxavir Marboxil ,80 MG Dose, (XOFLUZA , 80 MG DOSE,) 1 x 80 MG TBPK Single dose by mouth within 48 hours of symptom onset or exposure 1 each 0   celecoxib  (CELEBREX ) 100 MG capsule Take 1 capsule (100 mg total) by mouth daily as needed. 30 capsule 0   celecoxib  (CELEBREX ) 100 MG capsule Take 1 capsule (100 mg total) by mouth daily as needed. (Patient not taking: Reported on 07/24/2024) 30 capsule 0   chlorthalidone  (HYGROTON ) 25 MG tablet Take 0.5 tablets (12.5 mg total) by mouth daily. Pt reports half a tablet 90 tablet 3   olmesartan  (BENICAR ) 40 MG tablet Take 1 tablet (40 mg total) by mouth every evening. 90 tablet 3   ondansetron   (ZOFRAN -ODT) 4 MG disintegrating tablet Take 1 tablet (4 mg total) by mouth every 8 (eight) hours as needed for nausea or vomiting. 20 tablet 0   Oxycodone  HCl 10 MG TABS Take 1 tablet (10 mg total) by mouth 3 (three) times daily as needed. 90 tablet 0   Oxycodone  HCl 10 MG TABS Take 1 tablet by mouth three times a day as needed 90 tablet 0   Oxycodone  HCl 10 MG TABS Take 1 tablet (10 mg total) by mouth 3 (three) times daily as needed. (Patient not taking: Reported on 07/24/2024) 90 tablet 0   Oxycodone  HCl 10 MG TABS Take 1 tablet (10 mg total) by mouth 3 (three) times daily as needed. (Patient not taking: Reported on 07/24/2024) 90 tablet 0   Oxycodone  HCl 10 MG TABS Take 1 tablet by mouth three times a day as needed (Patient not taking: Reported on 07/24/2024) 90 tablet 0   Oxycodone  HCl 10 MG TABS Take 1 tablet by mouth three times a day as needed (Patient not taking: Reported on 08/14/2024) 90 tablet 0   Oxycodone  HCl 10 MG TABS Take 1 tablet (10 mg total) by mouth 3 (three) times daily as needed. 90 tablet 0   [START ON 10/23/2024] Oxycodone  HCl 10 MG TABS Take 1 tablet (10 mg total) by mouth 4 (four) times daily as needed. 120 tablet 0  Oxycodone  HCl 10 MG TABS Take 1 tablet (10 mg total) by mouth 4 (four) times daily as needed. 120 tablet 0   pregabalin  (LYRICA ) 50 MG capsule Take 1 capsule (50 mg total) by mouth 2 (two) times daily as needed. 60 capsule 3   pregabalin  (LYRICA ) 50 MG capsule Take 1 capsule (50 mg total) by mouth 2 (two) times daily as needed. 60 capsule 3   pregabalin  (LYRICA ) 50 MG capsule Take 1 capsule (50 mg total) by mouth 2 (two) times daily as needed. (Patient not taking: Reported on 08/14/2024) 60 capsule 3   Rimegepant Sulfate (NURTEC) 75 MG TBDP Take 1 tablet (75 mg total) by mouth every other day. 18 tablet 2   rosuvastatin  (CRESTOR ) 5 MG tablet Take 1 tablet (5 mg total) by mouth daily. (Patient taking differently: Take 5 mg by mouth every Monday, Wednesday, and  Friday.) 90 tablet 1   Topiramate  ER (TROKENDI  XR) 200 MG CP24 Take 1 capsule (200 mg total) by mouth daily at 12 noon. 90 capsule 3   No current facility-administered medications on file prior to visit.  [2]  Allergies Allergen Reactions   Bactrim [Sulfamethoxazole-Trimethoprim] Itching   Oxycodone -Acetaminophen Nausea Only    Dizziness and nausea.    "

## 2024-10-16 ENCOUNTER — Ambulatory Visit: Admitting: Neurology

## 2024-10-16 ENCOUNTER — Encounter: Payer: Self-pay | Admitting: Neurology

## 2024-10-16 VITALS — BP 126/78 | HR 73 | Ht 67.0 in | Wt 232.4 lb

## 2024-10-16 DIAGNOSIS — G43009 Migraine without aura, not intractable, without status migrainosus: Secondary | ICD-10-CM | POA: Diagnosis not present

## 2024-10-16 MED ORDER — RIZATRIPTAN BENZOATE 10 MG PO TBDP: 10.0000 mg | ORAL_TABLET | ORAL | 5 refills | Status: AC | PRN

## 2024-10-16 NOTE — Patient Instructions (Signed)
" °  Start NURTEC 1 TABLET EVERY OTHER DAY.  Contact us  in 3 months with update Continue TROKENDI  XR 200MG  DAILY Take RIZATRIPTAN  10MG  at earliest onset of headache.  May repeat dose once in 2 hours if needed.  Maximum 2 tablets in 24 hours. Take ONDANSETRON  for nausea Limit use of pain relievers to no more than 9 days out of the month.  These medications include acetaminophen, NSAIDs (ibuprofen/Advil/Motrin, naproxen/Aleve, triptans (Imitrex/sumatriptan), Excedrin, and narcotics.  This will help reduce risk of rebound headaches. Be aware of common food triggers:  - Caffeine:  coffee, black tea, cola, Mt. Dew  - Chocolate  - Dairy:  aged cheeses (brie, blue, cheddar, gouda, Parmasan, provolone, romano, Swiss, etc), chocolate milk, buttermilk, sour cream, limit eggs and yogurt  - Nuts, peanut butter  - Alcohol  - Cereals/grains:  FRESH breads (fresh bagels, sourdough, doughnuts), yeast productions  - Processed/canned/aged/cured meats (pre-packaged deli meats, hotdogs)  - MSG/glutamate:  soy sauce, flavor enhancer, pickled/preserved/marinated foods  - Sweeteners:  aspartame (Equal, Nutrasweet).  Sugar and Splenda are okay  - Vegetables:  legumes (lima beans, lentils, snow peas, fava beans, pinto peans, peas, garbanzo beans), sauerkraut, onions, olives, pickles  - Fruit:  avocados, bananas, citrus fruit (orange, lemon, grapefruit), mango  - Other:  Frozen meals, macaroni and cheese Routine exercise Stay adequately hydrated (aim for 64 oz water daily) Keep headache diary Maintain proper stress management Maintain proper sleep hygiene Do not skip meals Consider supplements:  magnesium citrate 400mg  daily, riboflavin 400mg  daily, coenzyme Q10 100mg  three times daily.  "

## 2024-11-09 ENCOUNTER — Other Ambulatory Visit (HOSPITAL_BASED_OUTPATIENT_CLINIC_OR_DEPARTMENT_OTHER): Payer: Self-pay

## 2024-11-13 ENCOUNTER — Encounter (HOSPITAL_BASED_OUTPATIENT_CLINIC_OR_DEPARTMENT_OTHER): Admitting: Cardiology

## 2024-11-13 DIAGNOSIS — G4733 Obstructive sleep apnea (adult) (pediatric): Secondary | ICD-10-CM

## 2024-11-19 NOTE — Procedures (Signed)
" ° °  SLEEP STUDY REPORT Patient Information Study Date: 11/13/2024 Patient Name: Jasmin Swanson Patient ID: [id number] Birth Date: Sep 17, 1971 Age: 54 Gender: Female BMI: 37.7 (W=240 lb, H=5' 7'') Referring Physician: Madonna Large, DO  TEST DESCRIPTION: Home sleep apnea testing was completed using the WatchPat, a Type 1 device, utilizing peripheral arterial tonometry ([place]), chest movement, actigraphy, pulse oximetry, pulse rate, body position and snore. AHI was calculated with apnea and hypopnea using valid sleep time as the denominator. RDI includes apneas, hypopneas, and RERAs. The data acquired and the scoring of sleep and all associated events were performed in accordance with the recommended standards and specifications as outlined in the AASM Manual for the Scoring of Sleep and Associated Events 2.2.0 (2015).  FINDINGS:  1. Mild Obstructive Sleep Apnea with AHI 6.1/hr.  2. No Central Sleep Apnea with pAHIc 0.9/hr.  3. Oxygen desaturations as low as 89%.  4. Mild to moderate snoring was present. O2 sats were < 88% for 0 min.  5. Total sleep time was 7 hrs and 24 min.  6. 29.5% of total sleep time was spent in REM sleep.  7. Normal sleep onset latency at 22 min.  8. Shortened REM sleep onset latency at 45 min.  9. Total awakenings were 3. 10. Arrhythmia detection: None  DIAGNOSIS: Mild Obstructive Sleep Apnea (G47.33  RECOMMENDATIONS: 1. Clinical correlation of these findings is necessary. The decision to treat obstructive sleep apnea (OSA) is usually based on the presence of apnea symptoms or the presence of associated medical conditions such as Hypertension, Congestive Heart Failure, Atrial Fibrillation or Obesity. The most common symptoms of OSA are snoring, gasping for breath while sleeping, daytime sleepiness and fatigue. 2. Initiating apnea therapy is recommended given the presence of symptoms and/or associated conditions. Recommend proceeding with one of the following:  a.  Auto-CPAP therapy with a pressure range of 5-20cm H2O.  b. An oral appliance (OA) that can be obtained from certain dentists with expertise in sleep medicine. These are primarily of use in non-obese patients with mild and moderate disease.  c. An ENT consultation which may be useful to look for specific causes of obstruction and possible treatment options.  d. If patient is intolerant to PAP therapy, consider referral to ENT for evaluation for hypoglossal nerve stimulator. 3. Close follow-up is necessary to ensure success with CPAP or oral appliance therapy for maximum benefit . 4. A follow-up oximetry study on CPAP is recommended to assess the adequacy of therapy and determine the need for supplemental oxygen or the potential need for Bi-level therapy. An arterial blood gas to determine the adequacy of baseline ventilation and oxygenation should also be considered. 5. Healthy sleep recommendations include: adequate nightly sleep (normal 7-9 hrs/night), avoidance of caffeine after noon and alcohol near bedtime, and maintaining a sleep environment that is cool, dark and quiet. 6. Weight loss for overweight patients is recommended. Even modest amounts of weight loss can significantly improve the severity of sleep apnea. 7. Snoring recommendations include: weight loss where appropriate, side sleeping, and avoidance of alcohol before bed. 8. Operation of motor vehicle should be avoided when sleepy.  Signature: Wilbert Bihari, MD; [facility]); Diplomat, American Board of Sleep Medicine Electronically Signed: 11/19/2024 5:30:07 AM "

## 2024-11-23 ENCOUNTER — Telehealth: Payer: Self-pay | Admitting: *Deleted

## 2024-11-23 NOTE — Telephone Encounter (Signed)
-----   Message from Wilbert Bihari, MD sent at 11/19/2024  5:31 AM EST ----- Patient has very mild OSA - set up OV to discuss treatment options.

## 2024-11-23 NOTE — Telephone Encounter (Signed)
 The patient has been notified of the result and verbalized understanding.  All questions (if any) were answered. Joshua Dalton Seip, CMA 11/23/2024 4:52 PM    Patient is scheduled for 01/15/25.

## 2025-01-15 ENCOUNTER — Ambulatory Visit: Admitting: Cardiology

## 2025-05-27 ENCOUNTER — Ambulatory Visit: Payer: Self-pay | Admitting: Neurology
